# Patient Record
Sex: Male | Born: 1965 | Race: White | Hispanic: No | Marital: Married
Health system: Southern US, Academic
[De-identification: ages and names within clinical notes are randomized; demographics above are authoritative.]

## PROBLEM LIST (undated history)

## (undated) ENCOUNTER — Encounter

## (undated) ENCOUNTER — Telehealth

## (undated) ENCOUNTER — Encounter: Attending: Medical | Primary: Medical

## (undated) ENCOUNTER — Ambulatory Visit: Payer: PRIVATE HEALTH INSURANCE

## (undated) ENCOUNTER — Ambulatory Visit: Payer: PRIVATE HEALTH INSURANCE | Attending: Medical | Primary: Medical

## (undated) ENCOUNTER — Ambulatory Visit: Payer: BLUE CROSS/BLUE SHIELD

## (undated) ENCOUNTER — Telehealth: Payer: BLUE CROSS/BLUE SHIELD | Attending: Pain Medicine | Primary: Pain Medicine

## (undated) ENCOUNTER — Telehealth: Attending: Medical | Primary: Medical

## (undated) ENCOUNTER — Ambulatory Visit: Payer: Medicaid (Managed Care) | Attending: Medical | Primary: Medical

## (undated) ENCOUNTER — Ambulatory Visit

## (undated) ENCOUNTER — Encounter: Attending: Cardiovascular Disease | Primary: Cardiovascular Disease

## (undated) ENCOUNTER — Ambulatory Visit: Payer: Medicaid (Managed Care)

## (undated) ENCOUNTER — Telehealth: Attending: Cardiovascular Disease | Primary: Cardiovascular Disease

## (undated) ENCOUNTER — Telehealth: Attending: Pain Medicine | Primary: Pain Medicine

## (undated) ENCOUNTER — Encounter: Attending: Pain Medicine | Primary: Pain Medicine

## (undated) ENCOUNTER — Encounter: Payer: BLUE CROSS/BLUE SHIELD | Attending: Cardiovascular Disease | Primary: Cardiovascular Disease

## (undated) ENCOUNTER — Ambulatory Visit: Payer: PRIVATE HEALTH INSURANCE | Attending: Cardiovascular Disease | Primary: Cardiovascular Disease

## (undated) ENCOUNTER — Inpatient Hospital Stay: Payer: Medicaid (Managed Care)

## (undated) ENCOUNTER — Ambulatory Visit: Payer: Medicaid (Managed Care) | Attending: Cardiovascular Disease | Primary: Cardiovascular Disease

---
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
## 2017-04-26 ENCOUNTER — Ambulatory Visit
Admission: RE | Admit: 2017-04-26 | Discharge: 2017-04-26 | Disposition: A | Discharge status: Home / Self Care | Payer: PRIVATE HEALTH INSURANCE

## 2017-04-26 DIAGNOSIS — M461 Sacroiliitis, not elsewhere classified: Secondary | ICD-10-CM

## 2017-04-26 DIAGNOSIS — G894 Chronic pain syndrome: Principal | ICD-10-CM

## 2017-04-26 DIAGNOSIS — M4316 Spondylolisthesis, lumbar region: Secondary | ICD-10-CM

## 2017-04-26 DIAGNOSIS — Z981 Arthrodesis status: Secondary | ICD-10-CM

## 2017-04-30 MED ORDER — OXYCODONE ER 36 MG CAPSULE SPRINKLE EXTENDED RELEASE 12HR(DON'T CRUSH)
Freq: Two times a day (BID) | ORAL | 0 refills | 0.00000 days | Status: CP
Start: 2017-04-30 — End: 2017-06-28

## 2017-05-06 ENCOUNTER — Ambulatory Visit
Admission: RE | Admit: 2017-05-06 | Discharge: 2017-05-06 | Disposition: A | Discharge status: Home / Self Care | Payer: PRIVATE HEALTH INSURANCE

## 2017-05-06 ENCOUNTER — Ambulatory Visit
Admission: RE | Admit: 2017-05-06 | Discharge: 2017-05-06 | Disposition: A | Discharge status: Home / Self Care | Payer: MEDICAID

## 2017-05-06 DIAGNOSIS — G894 Chronic pain syndrome: Principal | ICD-10-CM

## 2017-05-06 DIAGNOSIS — M961 Postlaminectomy syndrome, not elsewhere classified: Principal | ICD-10-CM

## 2017-05-10 MED ORDER — BUTALBITAL-ACETAMINOPHEN-CAFFEINE 50 MG-300 MG-40 MG CAPSULE
ORAL_CAPSULE | Freq: Four times a day (QID) | ORAL | 0 refills | 0 days | Status: CP | PRN
Start: 2017-05-10 — End: 2017-05-11

## 2017-05-11 MED ORDER — BUTALBITAL-ACETAMINOPHEN-CAFFEINE 50 MG-300 MG-40 MG CAPSULE
ORAL_CAPSULE | Freq: Four times a day (QID) | ORAL | 0 refills | 0 days | Status: CP | PRN
Start: 2017-05-11 — End: 2017-06-10

## 2017-05-30 MED ORDER — OXYCODONE ER 36 MG CAPSULE SPRINKLE EXTENDED RELEASE 12HR(DON'T CRUSH)
Freq: Two times a day (BID) | ORAL | 0 refills | 0 days | Status: CP
Start: 2017-05-30 — End: 2018-02-22

## 2017-06-01 ENCOUNTER — Ambulatory Visit
Admission: RE | Admit: 2017-06-01 | Discharge: 2017-06-01 | Disposition: A | Discharge status: Home / Self Care | Payer: MEDICAID

## 2017-06-01 DIAGNOSIS — M47819 Spondylosis without myelopathy or radiculopathy, site unspecified: Secondary | ICD-10-CM

## 2017-06-01 DIAGNOSIS — M461 Sacroiliitis, not elsewhere classified: Secondary | ICD-10-CM

## 2017-06-01 DIAGNOSIS — M5416 Radiculopathy, lumbar region: Secondary | ICD-10-CM

## 2017-06-01 DIAGNOSIS — M7060 Trochanteric bursitis, unspecified hip: Secondary | ICD-10-CM

## 2017-06-01 DIAGNOSIS — M4310 Spondylolisthesis, site unspecified: Secondary | ICD-10-CM

## 2017-06-01 DIAGNOSIS — M961 Postlaminectomy syndrome, not elsewhere classified: Secondary | ICD-10-CM

## 2017-06-01 DIAGNOSIS — M47817 Spondylosis without myelopathy or radiculopathy, lumbosacral region: Secondary | ICD-10-CM

## 2017-06-01 DIAGNOSIS — M4316 Spondylolisthesis, lumbar region: Principal | ICD-10-CM

## 2017-06-25 ENCOUNTER — Ambulatory Visit: Admission: RE | Admit: 2017-06-25 | Discharge: 2017-06-25 | Payer: MEDICAID

## 2017-06-25 ENCOUNTER — Ambulatory Visit: Admission: RE | Admit: 2017-06-25 | Discharge: 2017-06-25 | Payer: PRIVATE HEALTH INSURANCE

## 2017-06-25 DIAGNOSIS — M47817 Spondylosis without myelopathy or radiculopathy, lumbosacral region: Secondary | ICD-10-CM

## 2017-06-25 DIAGNOSIS — M4316 Spondylolisthesis, lumbar region: Secondary | ICD-10-CM

## 2017-06-25 DIAGNOSIS — Z981 Arthrodesis status: Secondary | ICD-10-CM

## 2017-06-25 DIAGNOSIS — M5416 Radiculopathy, lumbar region: Secondary | ICD-10-CM

## 2017-06-25 DIAGNOSIS — M62830 Muscle spasm of back: Secondary | ICD-10-CM

## 2017-06-25 DIAGNOSIS — M461 Sacroiliitis, not elsewhere classified: Principal | ICD-10-CM

## 2017-06-25 DIAGNOSIS — M7060 Trochanteric bursitis, unspecified hip: Secondary | ICD-10-CM

## 2017-06-25 DIAGNOSIS — M47819 Spondylosis without myelopathy or radiculopathy, site unspecified: Secondary | ICD-10-CM

## 2017-06-25 DIAGNOSIS — M961 Postlaminectomy syndrome, not elsewhere classified: Secondary | ICD-10-CM

## 2017-06-28 ENCOUNTER — Ambulatory Visit
Admission: RE | Admit: 2017-06-28 | Discharge: 2017-06-28 | Disposition: A | Discharge status: Home / Self Care | Payer: PRIVATE HEALTH INSURANCE

## 2017-06-28 DIAGNOSIS — M4316 Spondylolisthesis, lumbar region: Secondary | ICD-10-CM

## 2017-06-28 DIAGNOSIS — M7060 Trochanteric bursitis, unspecified hip: Secondary | ICD-10-CM

## 2017-06-28 DIAGNOSIS — F1190 Opioid use, unspecified, uncomplicated: Secondary | ICD-10-CM

## 2017-06-28 DIAGNOSIS — M461 Sacroiliitis, not elsewhere classified: Principal | ICD-10-CM

## 2017-06-28 DIAGNOSIS — Z981 Arthrodesis status: Secondary | ICD-10-CM

## 2017-06-28 MED ORDER — OXYCODONE ER 36 MG CAPSULE SPRINKLE EXTENDED RELEASE 12HR(DON'T CRUSH): 36 mg | each | Freq: Two times a day (BID) | 0 refills | 0 days | Status: AC

## 2017-06-29 MED ORDER — OXYCODONE ER 36 MG CAPSULE SPRINKLE EXTENDED RELEASE 12HR(DON'T CRUSH)
Freq: Two times a day (BID) | ORAL | 0 refills | 0 days | Status: CP
Start: 2017-06-29 — End: 2017-06-28

## 2017-07-12 ENCOUNTER — Ambulatory Visit
Admission: RE | Admit: 2017-07-12 | Discharge: 2017-07-12 | Disposition: A | Discharge status: Home / Self Care | Payer: MEDICAID

## 2017-07-12 ENCOUNTER — Ambulatory Visit
Admission: RE | Admit: 2017-07-12 | Discharge: 2017-07-12 | Disposition: A | Discharge status: Home / Self Care | Payer: MEDICAID | Attending: Pain Medicine | Admitting: Pain Medicine

## 2017-07-12 DIAGNOSIS — M461 Sacroiliitis, not elsewhere classified: Principal | ICD-10-CM

## 2017-07-29 MED ORDER — OXYCODONE ER 36 MG CAPSULE SPRINKLE EXTENDED RELEASE 12HR(DON'T CRUSH)
Freq: Two times a day (BID) | ORAL | 0 refills | 0.00000 days | Status: CP
Start: 2017-07-29 — End: 2017-06-28

## 2017-08-27 ENCOUNTER — Ambulatory Visit: Admission: RE | Admit: 2017-08-27 | Discharge: 2017-08-27 | Payer: MEDICAID

## 2017-08-27 DIAGNOSIS — F1190 Opioid use, unspecified, uncomplicated: Secondary | ICD-10-CM

## 2017-08-27 DIAGNOSIS — Z981 Arthrodesis status: Secondary | ICD-10-CM

## 2017-08-27 DIAGNOSIS — M4316 Spondylolisthesis, lumbar region: Secondary | ICD-10-CM

## 2017-08-27 DIAGNOSIS — M461 Sacroiliitis, not elsewhere classified: Principal | ICD-10-CM

## 2017-08-30 ENCOUNTER — Ambulatory Visit
Admission: RE | Admit: 2017-08-30 | Discharge: 2017-08-30 | Disposition: A | Discharge status: Home / Self Care | Payer: PRIVATE HEALTH INSURANCE

## 2017-08-30 DIAGNOSIS — Z981 Arthrodesis status: Secondary | ICD-10-CM

## 2017-08-30 DIAGNOSIS — F1190 Opioid use, unspecified, uncomplicated: Secondary | ICD-10-CM

## 2017-08-30 DIAGNOSIS — M4316 Spondylolisthesis, lumbar region: Secondary | ICD-10-CM

## 2017-08-30 DIAGNOSIS — M461 Sacroiliitis, not elsewhere classified: Principal | ICD-10-CM

## 2017-08-30 MED ORDER — OXYCODONE ER 36 MG CAPSULE SPRINKLE EXTENDED RELEASE 12HR(DON'T CRUSH)
Freq: Two times a day (BID) | ORAL | 0 refills | 0.00000 days | Status: CP
Start: 2017-08-30 — End: 2018-02-22

## 2017-08-30 MED ORDER — OXYCODONE-ACETAMINOPHEN 5 MG-325 MG TABLET
ORAL_TABLET | Freq: Four times a day (QID) | ORAL | 0 refills | 0 days | Status: CP | PRN
Start: 2017-08-30 — End: 2017-10-29

## 2017-09-29 MED ORDER — OXYCODONE ER 36 MG CAPSULE SPRINKLE EXTENDED RELEASE 12HR(DON'T CRUSH)
Freq: Two times a day (BID) | ORAL | 0 refills | 0.00000 days | Status: CP
Start: 2017-09-29 — End: 2017-10-29

## 2017-09-30 MED ORDER — MORPHINE ER 30 MG TABLET,EXTENDED RELEASE
ORAL_TABLET | Freq: Three times a day (TID) | ORAL | 0 refills | 0.00000 days | Status: CP
Start: 2017-09-30 — End: 2017-10-30

## 2017-10-27 MED ORDER — DIAZEPAM 5 MG TABLET
ORAL_TABLET | Freq: Two times a day (BID) | ORAL | 0 refills | 0.00000 days | Status: CP
Start: 2017-10-27 — End: 2017-11-01

## 2017-10-28 ENCOUNTER — Encounter
Admit: 2017-10-28 | Discharge: 2017-10-28 | Disposition: A | Discharge status: Home / Self Care | Payer: BLUE CROSS/BLUE SHIELD

## 2017-10-29 ENCOUNTER — Ambulatory Visit: Admit: 2017-10-29 | Discharge: 2017-10-30 | Payer: BLUE CROSS/BLUE SHIELD

## 2017-10-29 DIAGNOSIS — M47817 Spondylosis without myelopathy or radiculopathy, lumbosacral region: Secondary | ICD-10-CM

## 2017-10-29 DIAGNOSIS — Z981 Arthrodesis status: Secondary | ICD-10-CM

## 2017-10-29 DIAGNOSIS — G894 Chronic pain syndrome: Principal | ICD-10-CM

## 2017-10-29 DIAGNOSIS — M5416 Radiculopathy, lumbar region: Secondary | ICD-10-CM

## 2017-10-29 MED ORDER — PREGABALIN 75 MG CAPSULE
ORAL_CAPSULE | Freq: Three times a day (TID) | ORAL | 0 refills | 0 days | Status: CP
Start: 2017-10-29 — End: 2017-12-08

## 2017-10-29 MED ORDER — MORPHINE 15 MG IMMEDIATE RELEASE TABLET
ORAL_TABLET | Freq: Two times a day (BID) | ORAL | 0 refills | 0.00000 days | Status: CP | PRN
Start: 2017-10-29 — End: 2017-11-18

## 2017-10-29 MED ORDER — FENTANYL 25 MCG/HR TRANSDERMAL PATCH
MEDICATED_PATCH | TRANSDERMAL | 0 refills | 0 days | Status: CP
Start: 2017-10-29 — End: 2017-11-18

## 2017-11-02 MED ORDER — OXYCODONE-ACETAMINOPHEN 10 MG-325 MG TABLET
ORAL_TABLET | Freq: Two times a day (BID) | ORAL | 0 refills | 0.00000 days | Status: CP | PRN
Start: 2017-11-02 — End: 2017-11-18

## 2017-11-18 ENCOUNTER — Encounter: Admit: 2017-11-18 | Discharge: 2017-11-18 | Payer: BLUE CROSS/BLUE SHIELD

## 2017-11-18 ENCOUNTER — Ambulatory Visit
Admit: 2017-11-18 | Discharge: 2017-11-18 | Payer: BLUE CROSS/BLUE SHIELD | Attending: Pain Medicine | Primary: Pain Medicine

## 2017-11-18 DIAGNOSIS — M5416 Radiculopathy, lumbar region: Principal | ICD-10-CM

## 2017-11-18 DIAGNOSIS — Z79899 Other long term (current) drug therapy: Secondary | ICD-10-CM

## 2017-11-18 DIAGNOSIS — G894 Chronic pain syndrome: Secondary | ICD-10-CM

## 2017-11-18 MED ORDER — FENTANYL 25 MCG/HR TRANSDERMAL PATCH
MEDICATED_PATCH | TRANSDERMAL | 0 refills | 0 days | Status: CP
Start: 2017-11-18 — End: 2017-12-08

## 2017-11-18 MED ORDER — OXYCODONE-ACETAMINOPHEN 10 MG-325 MG TABLET
ORAL_TABLET | Freq: Two times a day (BID) | ORAL | 0 refills | 0 days | Status: CP | PRN
Start: 2017-11-18 — End: 2017-12-08

## 2017-12-08 ENCOUNTER — Encounter: Admit: 2017-12-08 | Discharge: 2017-12-09 | Payer: BLUE CROSS/BLUE SHIELD

## 2017-12-08 DIAGNOSIS — M7060 Trochanteric bursitis, unspecified hip: Secondary | ICD-10-CM

## 2017-12-08 DIAGNOSIS — M62830 Muscle spasm of back: Secondary | ICD-10-CM

## 2017-12-08 DIAGNOSIS — M47817 Spondylosis without myelopathy or radiculopathy, lumbosacral region: Secondary | ICD-10-CM

## 2017-12-08 DIAGNOSIS — M961 Postlaminectomy syndrome, not elsewhere classified: Secondary | ICD-10-CM

## 2017-12-08 DIAGNOSIS — M4316 Spondylolisthesis, lumbar region: Principal | ICD-10-CM

## 2017-12-08 DIAGNOSIS — Z981 Arthrodesis status: Secondary | ICD-10-CM

## 2017-12-08 DIAGNOSIS — M47819 Spondylosis without myelopathy or radiculopathy, site unspecified: Secondary | ICD-10-CM

## 2017-12-08 DIAGNOSIS — M461 Sacroiliitis, not elsewhere classified: Secondary | ICD-10-CM

## 2017-12-08 DIAGNOSIS — G894 Chronic pain syndrome: Secondary | ICD-10-CM

## 2017-12-08 DIAGNOSIS — M5416 Radiculopathy, lumbar region: Secondary | ICD-10-CM

## 2017-12-08 MED ORDER — PREGABALIN 75 MG CAPSULE
ORAL_CAPSULE | Freq: Three times a day (TID) | ORAL | 1 refills | 0.00000 days | Status: CP
Start: 2017-12-08 — End: 2018-02-07

## 2017-12-18 MED ORDER — FENTANYL 25 MCG/HR TRANSDERMAL PATCH
MEDICATED_PATCH | TRANSDERMAL | 0 refills | 0 days | Status: CP
Start: 2017-12-18 — End: 2018-02-07

## 2017-12-18 MED ORDER — OXYCODONE-ACETAMINOPHEN 10 MG-325 MG TABLET
ORAL_TABLET | Freq: Two times a day (BID) | ORAL | 0 refills | 0 days | Status: CP | PRN
Start: 2017-12-18 — End: 2018-02-07

## 2017-12-29 ENCOUNTER — Encounter: Admit: 2017-12-29 | Discharge: 2017-12-30 | Payer: BLUE CROSS/BLUE SHIELD

## 2017-12-29 DIAGNOSIS — M961 Postlaminectomy syndrome, not elsewhere classified: Secondary | ICD-10-CM

## 2017-12-29 DIAGNOSIS — M47817 Spondylosis without myelopathy or radiculopathy, lumbosacral region: Secondary | ICD-10-CM

## 2017-12-29 DIAGNOSIS — Z981 Arthrodesis status: Secondary | ICD-10-CM

## 2017-12-29 DIAGNOSIS — M62830 Muscle spasm of back: Secondary | ICD-10-CM

## 2017-12-29 DIAGNOSIS — G894 Chronic pain syndrome: Secondary | ICD-10-CM

## 2017-12-29 DIAGNOSIS — M7060 Trochanteric bursitis, unspecified hip: Secondary | ICD-10-CM

## 2017-12-29 DIAGNOSIS — M47819 Spondylosis without myelopathy or radiculopathy, site unspecified: Secondary | ICD-10-CM

## 2017-12-29 DIAGNOSIS — M461 Sacroiliitis, not elsewhere classified: Secondary | ICD-10-CM

## 2017-12-29 DIAGNOSIS — M5416 Radiculopathy, lumbar region: Secondary | ICD-10-CM

## 2017-12-29 DIAGNOSIS — M4316 Spondylolisthesis, lumbar region: Principal | ICD-10-CM

## 2018-01-17 MED ORDER — OXYCODONE-ACETAMINOPHEN 10 MG-325 MG TABLET
ORAL_TABLET | Freq: Two times a day (BID) | ORAL | 0 refills | 0 days | Status: CP | PRN
Start: 2018-01-17 — End: 2018-02-07

## 2018-01-17 MED ORDER — FENTANYL 25 MCG/HR TRANSDERMAL PATCH
MEDICATED_PATCH | TRANSDERMAL | 0 refills | 0 days | Status: CP
Start: 2018-01-17 — End: 2018-02-07

## 2018-02-07 ENCOUNTER — Encounter: Admit: 2018-02-07 | Discharge: 2018-02-07 | Payer: BLUE CROSS/BLUE SHIELD

## 2018-02-07 ENCOUNTER — Ambulatory Visit: Admit: 2018-02-07 | Discharge: 2018-02-07 | Payer: BLUE CROSS/BLUE SHIELD

## 2018-02-07 DIAGNOSIS — Z981 Arthrodesis status: Secondary | ICD-10-CM

## 2018-02-07 DIAGNOSIS — M4316 Spondylolisthesis, lumbar region: Principal | ICD-10-CM

## 2018-02-07 DIAGNOSIS — M5412 Radiculopathy, cervical region: Principal | ICD-10-CM

## 2018-02-07 DIAGNOSIS — G894 Chronic pain syndrome: Secondary | ICD-10-CM

## 2018-02-07 DIAGNOSIS — M7060 Trochanteric bursitis, unspecified hip: Secondary | ICD-10-CM

## 2018-02-07 DIAGNOSIS — M5416 Radiculopathy, lumbar region: Secondary | ICD-10-CM

## 2018-02-07 DIAGNOSIS — M461 Sacroiliitis, not elsewhere classified: Secondary | ICD-10-CM

## 2018-02-07 MED ORDER — PREGABALIN 75 MG CAPSULE
ORAL_CAPSULE | Freq: Three times a day (TID) | ORAL | 1 refills | 0.00000 days | Status: CP
Start: 2018-02-07 — End: 2018-08-04

## 2018-02-15 ENCOUNTER — Ambulatory Visit: Admit: 2018-02-15 | Payer: BLUE CROSS/BLUE SHIELD

## 2018-02-16 MED ORDER — OXYCODONE-ACETAMINOPHEN 10 MG-325 MG TABLET
ORAL_TABLET | Freq: Two times a day (BID) | ORAL | 0 refills | 0.00000 days | Status: CP | PRN
Start: 2018-02-16 — End: 2018-04-08

## 2018-02-16 MED ORDER — FENTANYL 25 MCG/HR TRANSDERMAL PATCH
MEDICATED_PATCH | TRANSDERMAL | 0 refills | 0 days | Status: CP
Start: 2018-02-16 — End: 2018-04-08

## 2018-02-19 ENCOUNTER — Encounter: Admit: 2018-02-19 | Discharge: 2018-02-20 | Payer: BLUE CROSS/BLUE SHIELD

## 2018-02-22 ENCOUNTER — Encounter: Admit: 2018-02-22 | Discharge: 2018-02-22 | Payer: BLUE CROSS/BLUE SHIELD

## 2018-02-22 DIAGNOSIS — M5416 Radiculopathy, lumbar region: Principal | ICD-10-CM

## 2018-03-18 MED ORDER — OXYCODONE-ACETAMINOPHEN 10 MG-325 MG TABLET
ORAL_TABLET | Freq: Two times a day (BID) | ORAL | 0 refills | 0.00000 days | Status: CP | PRN
Start: 2018-03-18 — End: 2018-04-08

## 2018-03-18 MED ORDER — FENTANYL 25 MCG/HR TRANSDERMAL PATCH
MEDICATED_PATCH | TRANSDERMAL | 0 refills | 0 days | Status: CP
Start: 2018-03-18 — End: 2018-04-08

## 2018-04-08 ENCOUNTER — Encounter: Admit: 2018-04-08 | Discharge: 2018-04-09 | Payer: BLUE CROSS/BLUE SHIELD

## 2018-04-08 DIAGNOSIS — M5416 Radiculopathy, lumbar region: Secondary | ICD-10-CM

## 2018-04-08 DIAGNOSIS — M47819 Spondylosis without myelopathy or radiculopathy, site unspecified: Secondary | ICD-10-CM

## 2018-04-08 DIAGNOSIS — G894 Chronic pain syndrome: Principal | ICD-10-CM

## 2018-04-08 DIAGNOSIS — M5412 Radiculopathy, cervical region: Secondary | ICD-10-CM

## 2018-04-08 DIAGNOSIS — Z981 Arthrodesis status: Secondary | ICD-10-CM

## 2018-04-17 MED ORDER — OXYCODONE-ACETAMINOPHEN 10 MG-325 MG TABLET
ORAL_TABLET | Freq: Two times a day (BID) | ORAL | 0 refills | 0.00000 days | Status: CP | PRN
Start: 2018-04-17 — End: 2018-05-17

## 2018-04-17 MED ORDER — FENTANYL 25 MCG/HR TRANSDERMAL PATCH
MEDICATED_PATCH | TRANSDERMAL | 0 refills | 0.00000 days | Status: CP
Start: 2018-04-17 — End: 2018-05-17

## 2018-04-19 ENCOUNTER — Encounter: Admit: 2018-04-19 | Discharge: 2018-04-19 | Payer: BLUE CROSS/BLUE SHIELD

## 2018-04-19 DIAGNOSIS — M62830 Muscle spasm of back: Secondary | ICD-10-CM

## 2018-04-19 DIAGNOSIS — M5416 Radiculopathy, lumbar region: Secondary | ICD-10-CM

## 2018-04-19 DIAGNOSIS — M4310 Spondylolisthesis, site unspecified: Secondary | ICD-10-CM

## 2018-04-19 DIAGNOSIS — M4316 Spondylolisthesis, lumbar region: Secondary | ICD-10-CM

## 2018-04-19 DIAGNOSIS — M545 Low back pain: Principal | ICD-10-CM

## 2018-04-19 DIAGNOSIS — M47819 Spondylosis without myelopathy or radiculopathy, site unspecified: Secondary | ICD-10-CM

## 2018-04-19 DIAGNOSIS — M47817 Spondylosis without myelopathy or radiculopathy, lumbosacral region: Principal | ICD-10-CM

## 2018-04-19 DIAGNOSIS — M7060 Trochanteric bursitis, unspecified hip: Secondary | ICD-10-CM

## 2018-04-19 DIAGNOSIS — M461 Sacroiliitis, not elsewhere classified: Secondary | ICD-10-CM

## 2018-04-19 DIAGNOSIS — M961 Postlaminectomy syndrome, not elsewhere classified: Secondary | ICD-10-CM

## 2018-04-19 DIAGNOSIS — Z981 Arthrodesis status: Secondary | ICD-10-CM

## 2018-05-17 MED ORDER — OXYCODONE-ACETAMINOPHEN 10 MG-325 MG TABLET
ORAL_TABLET | Freq: Two times a day (BID) | ORAL | 0 refills | 0.00000 days | Status: CP | PRN
Start: 2018-05-17 — End: 2018-07-27

## 2018-05-17 MED ORDER — FENTANYL 25 MCG/HR TRANSDERMAL PATCH
MEDICATED_PATCH | TRANSDERMAL | 0 refills | 0.00000 days | Status: CP
Start: 2018-05-17 — End: 2018-07-07

## 2018-06-28 ENCOUNTER — Encounter: Admit: 2018-06-28 | Discharge: 2018-06-29 | Payer: BLUE CROSS/BLUE SHIELD

## 2018-06-28 DIAGNOSIS — Z981 Arthrodesis status: Secondary | ICD-10-CM

## 2018-06-28 DIAGNOSIS — M4316 Spondylolisthesis, lumbar region: Secondary | ICD-10-CM

## 2018-06-28 DIAGNOSIS — M47817 Spondylosis without myelopathy or radiculopathy, lumbosacral region: Principal | ICD-10-CM

## 2018-06-28 DIAGNOSIS — G894 Chronic pain syndrome: Secondary | ICD-10-CM

## 2018-06-28 DIAGNOSIS — M461 Sacroiliitis, not elsewhere classified: Secondary | ICD-10-CM

## 2018-06-28 MED ORDER — OXYCODONE-ACETAMINOPHEN 10 MG-325 MG TABLET
ORAL_TABLET | ORAL | 0 refills | 0.00000 days | Status: CP | PRN
Start: 2018-06-28 — End: 2018-07-27

## 2018-06-28 MED ORDER — OXYCODONE ER 18 MG CAPSULE SPRINKLE EXTENDED RELEASE 12HR(DON'T CRUSH)
ORAL_CAPSULE | Freq: Two times a day (BID) | ORAL | 0 refills | 0 days | Status: CP
Start: 2018-06-28 — End: 2018-07-28

## 2018-07-07 MED ORDER — FENTANYL 25 MCG/HR TRANSDERMAL PATCH
MEDICATED_PATCH | TRANSDERMAL | 0 refills | 0.00000 days | Status: CP
Start: 2018-07-07 — End: 2018-09-01

## 2018-07-19 ENCOUNTER — Encounter: Admit: 2018-07-19 | Discharge: 2018-07-19 | Payer: BLUE CROSS/BLUE SHIELD

## 2018-07-19 DIAGNOSIS — M47819 Spondylosis without myelopathy or radiculopathy, site unspecified: Principal | ICD-10-CM

## 2018-07-27 ENCOUNTER — Encounter: Admit: 2018-07-27 | Discharge: 2018-07-28 | Payer: BLUE CROSS/BLUE SHIELD

## 2018-07-27 DIAGNOSIS — M47817 Spondylosis without myelopathy or radiculopathy, lumbosacral region: Secondary | ICD-10-CM

## 2018-07-27 DIAGNOSIS — Z981 Arthrodesis status: Secondary | ICD-10-CM

## 2018-07-27 DIAGNOSIS — G894 Chronic pain syndrome: Principal | ICD-10-CM

## 2018-07-27 DIAGNOSIS — M5412 Radiculopathy, cervical region: Secondary | ICD-10-CM

## 2018-07-27 MED ORDER — OXYCODONE-ACETAMINOPHEN 10 MG-325 MG TABLET
ORAL_TABLET | 0 refills | 0 days | Status: CP
Start: 2018-07-27 — End: 2018-09-01

## 2018-08-12 ENCOUNTER — Encounter: Admit: 2018-08-12 | Discharge: 2018-08-12 | Payer: BLUE CROSS/BLUE SHIELD

## 2018-08-12 ENCOUNTER — Encounter
Admit: 2018-08-12 | Discharge: 2018-08-12 | Payer: BLUE CROSS/BLUE SHIELD | Attending: Pain Medicine | Primary: Pain Medicine

## 2018-08-12 DIAGNOSIS — M47816 Spondylosis without myelopathy or radiculopathy, lumbar region: Principal | ICD-10-CM

## 2018-08-16 MED ORDER — PREGABALIN 75 MG CAPSULE
Freq: Three times a day (TID) | ORAL | 1 refills | 0 days | Status: CP
Start: 2018-08-16 — End: 2018-11-20

## 2018-08-25 MED ORDER — MORPHINE ER 30 MG TABLET,EXTENDED RELEASE
ORAL_TABLET | Freq: Two times a day (BID) | ORAL | 0 refills | 0.00000 days | Status: CP
Start: 2018-08-25 — End: 2018-09-12

## 2018-08-26 MED ORDER — OXYCODONE-ACETAMINOPHEN 10 MG-325 MG TABLET
ORAL_TABLET | Freq: Three times a day (TID) | ORAL | 0 refills | 0.00000 days | Status: CP | PRN
Start: 2018-08-26 — End: 2018-09-12

## 2018-09-01 ENCOUNTER — Encounter
Admit: 2018-09-01 | Discharge: 2018-09-01 | Payer: BLUE CROSS/BLUE SHIELD | Attending: Internal Medicine | Primary: Internal Medicine

## 2018-09-01 DIAGNOSIS — R002 Palpitations: Principal | ICD-10-CM

## 2018-09-01 DIAGNOSIS — R55 Syncope and collapse: Secondary | ICD-10-CM

## 2018-09-01 DIAGNOSIS — E782 Mixed hyperlipidemia: Secondary | ICD-10-CM

## 2018-09-01 DIAGNOSIS — R0602 Shortness of breath: Secondary | ICD-10-CM

## 2018-09-06 ENCOUNTER — Ambulatory Visit: Admit: 2018-09-06 | Discharge: 2018-09-08 | Payer: BLUE CROSS/BLUE SHIELD

## 2018-09-06 ENCOUNTER — Encounter: Admit: 2018-09-06 | Discharge: 2018-09-08 | Payer: BLUE CROSS/BLUE SHIELD

## 2018-09-06 DIAGNOSIS — E782 Mixed hyperlipidemia: Secondary | ICD-10-CM

## 2018-09-06 DIAGNOSIS — M47819 Spondylosis without myelopathy or radiculopathy, site unspecified: Secondary | ICD-10-CM

## 2018-09-06 DIAGNOSIS — R0602 Shortness of breath: Principal | ICD-10-CM

## 2018-09-06 DIAGNOSIS — R002 Palpitations: Principal | ICD-10-CM

## 2018-09-06 DIAGNOSIS — R55 Syncope and collapse: Secondary | ICD-10-CM

## 2018-09-06 DIAGNOSIS — M4310 Spondylolisthesis, site unspecified: Secondary | ICD-10-CM

## 2018-09-06 DIAGNOSIS — M461 Sacroiliitis, not elsewhere classified: Secondary | ICD-10-CM

## 2018-09-12 ENCOUNTER — Encounter: Admit: 2018-09-12 | Discharge: 2018-09-13 | Payer: BLUE CROSS/BLUE SHIELD

## 2018-09-12 DIAGNOSIS — M7060 Trochanteric bursitis, unspecified hip: Secondary | ICD-10-CM

## 2018-09-12 DIAGNOSIS — Z981 Arthrodesis status: Secondary | ICD-10-CM

## 2018-09-12 DIAGNOSIS — M5412 Radiculopathy, cervical region: Secondary | ICD-10-CM

## 2018-09-12 DIAGNOSIS — M47817 Spondylosis without myelopathy or radiculopathy, lumbosacral region: Secondary | ICD-10-CM

## 2018-09-12 DIAGNOSIS — M4316 Spondylolisthesis, lumbar region: Secondary | ICD-10-CM

## 2018-09-12 DIAGNOSIS — M47816 Spondylosis without myelopathy or radiculopathy, lumbar region: Secondary | ICD-10-CM

## 2018-09-12 DIAGNOSIS — M461 Sacroiliitis, not elsewhere classified: Secondary | ICD-10-CM

## 2018-09-12 DIAGNOSIS — G894 Chronic pain syndrome: Secondary | ICD-10-CM

## 2018-09-12 DIAGNOSIS — M5416 Radiculopathy, lumbar region: Secondary | ICD-10-CM

## 2018-09-12 DIAGNOSIS — M47819 Spondylosis without myelopathy or radiculopathy, site unspecified: Principal | ICD-10-CM

## 2018-09-12 DIAGNOSIS — M4310 Spondylolisthesis, site unspecified: Secondary | ICD-10-CM

## 2018-09-15 MED ORDER — OXYCODONE-ACETAMINOPHEN 10 MG-325 MG TABLET
ORAL_TABLET | Freq: Three times a day (TID) | ORAL | 0 refills | 0.00000 days | Status: CP | PRN
Start: 2018-09-15 — End: 2018-09-25

## 2018-09-25 MED ORDER — OXYCODONE-ACETAMINOPHEN 10 MG-325 MG TABLET
ORAL_TABLET | Freq: Three times a day (TID) | ORAL | 0 refills | 0 days | Status: CP | PRN
Start: 2018-09-25 — End: 2018-11-22

## 2018-09-25 MED ORDER — MORPHINE ER 30 MG TABLET,EXTENDED RELEASE
ORAL_TABLET | Freq: Two times a day (BID) | ORAL | 0 refills | 0.00000 days | Status: CP
Start: 2018-09-25 — End: 2018-11-22

## 2018-10-25 MED ORDER — OXYCODONE-ACETAMINOPHEN 10 MG-325 MG TABLET
ORAL_TABLET | Freq: Three times a day (TID) | ORAL | 0 refills | 0.00000 days | Status: CP | PRN
Start: 2018-10-25 — End: 2018-11-22

## 2018-10-25 MED ORDER — MORPHINE ER 30 MG TABLET,EXTENDED RELEASE
ORAL_TABLET | Freq: Two times a day (BID) | ORAL | 0 refills | 0.00000 days | Status: CP
Start: 2018-10-25 — End: 2018-11-22

## 2018-11-20 MED ORDER — PREGABALIN 75 MG CAPSULE
0 refills | 0 days | Status: CP
Start: 2018-11-20 — End: 2019-04-19

## 2018-11-22 ENCOUNTER — Encounter: Admit: 2018-11-22 | Discharge: 2018-11-23 | Payer: BLUE CROSS/BLUE SHIELD

## 2018-11-22 DIAGNOSIS — Z981 Arthrodesis status: Principal | ICD-10-CM

## 2018-11-22 DIAGNOSIS — M4316 Spondylolisthesis, lumbar region: Principal | ICD-10-CM

## 2018-11-22 DIAGNOSIS — M5090 Cervical disc disorder, unspecified, unspecified cervical region: Principal | ICD-10-CM

## 2018-11-22 DIAGNOSIS — M47817 Spondylosis without myelopathy or radiculopathy, lumbosacral region: Principal | ICD-10-CM

## 2018-11-22 DIAGNOSIS — M5416 Radiculopathy, lumbar region: Principal | ICD-10-CM

## 2018-11-22 DIAGNOSIS — G894 Chronic pain syndrome: Principal | ICD-10-CM

## 2018-11-22 DIAGNOSIS — G43909 Migraine, unspecified, not intractable, without status migrainosus: Principal | ICD-10-CM

## 2018-11-22 DIAGNOSIS — M7060 Trochanteric bursitis, unspecified hip: Principal | ICD-10-CM

## 2018-11-22 DIAGNOSIS — E139 Other specified diabetes mellitus without complications: Principal | ICD-10-CM

## 2018-11-22 DIAGNOSIS — M5412 Radiculopathy, cervical region: Principal | ICD-10-CM

## 2018-11-22 DIAGNOSIS — M519 Unspecified thoracic, thoracolumbar and lumbosacral intervertebral disc disorder: Principal | ICD-10-CM

## 2018-11-22 DIAGNOSIS — M461 Sacroiliitis, not elsewhere classified: Principal | ICD-10-CM

## 2018-11-24 MED ORDER — MORPHINE ER 30 MG TABLET,EXTENDED RELEASE
ORAL_TABLET | Freq: Two times a day (BID) | ORAL | 0 refills | 0 days | Status: CP
Start: 2018-11-24 — End: 2018-12-24

## 2018-11-28 MED ORDER — OXYCODONE-ACETAMINOPHEN 10 MG-325 MG TABLET
ORAL_TABLET | Freq: Three times a day (TID) | ORAL | 0 refills | 0 days | Status: CP | PRN
Start: 2018-11-28 — End: 2018-12-24

## 2018-12-24 MED ORDER — MORPHINE ER 30 MG TABLET,EXTENDED RELEASE
ORAL_TABLET | Freq: Two times a day (BID) | ORAL | 0 refills | 0 days | Status: CP
Start: 2018-12-24 — End: 2019-02-14

## 2018-12-24 MED ORDER — OXYCODONE-ACETAMINOPHEN 10 MG-325 MG TABLET
ORAL_TABLET | Freq: Three times a day (TID) | ORAL | 0 refills | 0.00000 days | Status: CP | PRN
Start: 2018-12-24 — End: 2019-02-14

## 2019-01-23 MED ORDER — OXYCODONE-ACETAMINOPHEN 10 MG-325 MG TABLET
ORAL_TABLET | Freq: Three times a day (TID) | ORAL | 0 refills | 0 days | Status: CP | PRN
Start: 2019-01-23 — End: 2019-03-20

## 2019-01-23 MED ORDER — MORPHINE ER 30 MG TABLET,EXTENDED RELEASE
ORAL_TABLET | Freq: Two times a day (BID) | ORAL | 0 refills | 0 days | Status: CP
Start: 2019-01-23 — End: 2019-03-20

## 2019-01-29 ENCOUNTER — Ambulatory Visit: Admit: 2019-01-29 | Payer: BLUE CROSS/BLUE SHIELD

## 2019-02-22 MED ORDER — MORPHINE ER 30 MG TABLET,EXTENDED RELEASE
ORAL_TABLET | Freq: Two times a day (BID) | ORAL | 0 refills | 0 days | Status: CP
Start: 2019-02-22 — End: 2019-04-19

## 2019-02-22 MED ORDER — OXYCODONE-ACETAMINOPHEN 10 MG-325 MG TABLET
ORAL_TABLET | Freq: Three times a day (TID) | ORAL | 0 refills | 0 days | Status: CP | PRN
Start: 2019-02-22 — End: 2019-04-19

## 2019-03-08 ENCOUNTER — Encounter
Admit: 2019-03-08 | Discharge: 2019-03-08 | Disposition: A | Discharge status: Home / Self Care | Payer: BLUE CROSS/BLUE SHIELD | Attending: Emergency Medicine

## 2019-03-20 ENCOUNTER — Encounter: Admit: 2019-03-20 | Discharge: 2019-03-21 | Payer: BLUE CROSS/BLUE SHIELD

## 2019-03-20 DIAGNOSIS — M5416 Radiculopathy, lumbar region: Secondary | ICD-10-CM

## 2019-03-20 DIAGNOSIS — M47817 Spondylosis without myelopathy or radiculopathy, lumbosacral region: Secondary | ICD-10-CM

## 2019-03-20 DIAGNOSIS — Z981 Arthrodesis status: Secondary | ICD-10-CM

## 2019-03-20 DIAGNOSIS — G894 Chronic pain syndrome: Principal | ICD-10-CM

## 2019-03-20 DIAGNOSIS — F329 Major depressive disorder, single episode, unspecified: Secondary | ICD-10-CM

## 2019-03-20 MED ORDER — KETOROLAC 10 MG TABLET
ORAL_TABLET | Freq: Four times a day (QID) | ORAL | 0 refills | 0 days | Status: CP | PRN
Start: 2019-03-20 — End: 2019-03-25

## 2019-03-20 MED ORDER — DULOXETINE 30 MG CAPSULE,DELAYED RELEASE
ORAL_CAPSULE | 0 refills | 0 days | Status: CP
Start: 2019-03-20 — End: ?

## 2019-03-24 MED ORDER — OXYCODONE-ACETAMINOPHEN 10 MG-325 MG TABLET
ORAL_TABLET | Freq: Three times a day (TID) | ORAL | 0 refills | 0 days | Status: CP | PRN
Start: 2019-03-24 — End: 2019-04-23

## 2019-03-24 MED ORDER — MORPHINE ER 30 MG TABLET,EXTENDED RELEASE
ORAL_TABLET | Freq: Two times a day (BID) | ORAL | 0 refills | 0.00000 days | Status: CP
Start: 2019-03-24 — End: 2019-04-23

## 2019-04-22 MED ORDER — MORPHINE ER 30 MG TABLET,EXTENDED RELEASE
ORAL_TABLET | Freq: Two times a day (BID) | ORAL | 0 refills | 30 days | Status: CP
Start: 2019-04-22 — End: 2019-05-23

## 2019-04-22 MED ORDER — OXYCODONE-ACETAMINOPHEN 10 MG-325 MG TABLET
ORAL_TABLET | Freq: Three times a day (TID) | ORAL | 0 refills | 30 days | Status: CP | PRN
Start: 2019-04-22 — End: 2019-05-23

## 2019-05-23 DIAGNOSIS — M5412 Radiculopathy, cervical region: Secondary | ICD-10-CM

## 2019-05-23 DIAGNOSIS — Z981 Arthrodesis status: Secondary | ICD-10-CM

## 2019-05-23 DIAGNOSIS — G894 Chronic pain syndrome: Secondary | ICD-10-CM

## 2019-05-23 DIAGNOSIS — M47817 Spondylosis without myelopathy or radiculopathy, lumbosacral region: Secondary | ICD-10-CM

## 2019-06-22 ENCOUNTER — Ambulatory Visit: Admit: 2019-06-22 | Discharge: 2019-06-23 | Payer: BLUE CROSS/BLUE SHIELD

## 2019-06-22 DIAGNOSIS — M549 Dorsalgia, unspecified: Secondary | ICD-10-CM

## 2019-06-22 DIAGNOSIS — M47817 Spondylosis without myelopathy or radiculopathy, lumbosacral region: Secondary | ICD-10-CM

## 2019-06-22 DIAGNOSIS — G894 Chronic pain syndrome: Secondary | ICD-10-CM

## 2019-06-22 DIAGNOSIS — M5412 Radiculopathy, cervical region: Secondary | ICD-10-CM

## 2019-06-22 DIAGNOSIS — Z981 Arthrodesis status: Secondary | ICD-10-CM

## 2019-06-22 MED ORDER — OXYCODONE-ACETAMINOPHEN 10 MG-325 MG TABLET
ORAL_TABLET | Freq: Three times a day (TID) | ORAL | 0 refills | 30.00000 days | Status: CP | PRN
Start: 2019-06-22 — End: 2019-07-22

## 2019-06-22 MED ORDER — MORPHINE ER 30 MG TABLET,EXTENDED RELEASE
ORAL_TABLET | Freq: Two times a day (BID) | ORAL | 0 refills | 30.00000 days | Status: CP
Start: 2019-06-22 — End: 2019-07-23

## 2019-07-19 ENCOUNTER — Encounter: Admit: 2019-07-19 | Discharge: 2019-07-20 | Payer: BLUE CROSS/BLUE SHIELD

## 2019-07-19 DIAGNOSIS — M47817 Spondylosis without myelopathy or radiculopathy, lumbosacral region: Principal | ICD-10-CM

## 2019-07-19 DIAGNOSIS — Z981 Arthrodesis status: Principal | ICD-10-CM

## 2019-07-19 DIAGNOSIS — G894 Chronic pain syndrome: Principal | ICD-10-CM

## 2019-07-22 MED ORDER — MORPHINE ER 30 MG TABLET,EXTENDED RELEASE
ORAL_TABLET | Freq: Two times a day (BID) | ORAL | 0 refills | 30.00000 days | Status: CP
Start: 2019-07-22 — End: 2019-08-21

## 2019-07-22 MED ORDER — OXYCODONE-ACETAMINOPHEN 10 MG-325 MG TABLET
ORAL_TABLET | Freq: Three times a day (TID) | ORAL | 0 refills | 30.00000 days | Status: CP | PRN
Start: 2019-07-22 — End: 2019-08-21

## 2019-08-21 MED ORDER — MORPHINE ER 30 MG TABLET,EXTENDED RELEASE
ORAL_TABLET | Freq: Two times a day (BID) | ORAL | 0 refills | 30.00000 days | Status: CP
Start: 2019-08-21 — End: 2019-09-20

## 2019-08-21 MED ORDER — OXYCODONE-ACETAMINOPHEN 10 MG-325 MG TABLET
ORAL_TABLET | Freq: Three times a day (TID) | ORAL | 0 refills | 30.00000 days | Status: CP | PRN
Start: 2019-08-21 — End: 2019-09-20

## 2019-09-19 ENCOUNTER — Encounter: Admit: 2019-09-19 | Discharge: 2019-09-20 | Payer: BLUE CROSS/BLUE SHIELD

## 2019-09-19 DIAGNOSIS — Z981 Arthrodesis status: Principal | ICD-10-CM

## 2019-09-19 DIAGNOSIS — F1190 Opioid use, unspecified, uncomplicated: Principal | ICD-10-CM

## 2019-09-19 DIAGNOSIS — G894 Chronic pain syndrome: Principal | ICD-10-CM

## 2019-09-19 DIAGNOSIS — G8929 Other chronic pain: Principal | ICD-10-CM

## 2019-09-19 DIAGNOSIS — M545 Low back pain: Principal | ICD-10-CM

## 2019-09-19 DIAGNOSIS — M4316 Spondylolisthesis, lumbar region: Principal | ICD-10-CM

## 2019-09-19 DIAGNOSIS — Z79899 Other long term (current) drug therapy: Principal | ICD-10-CM

## 2019-09-19 DIAGNOSIS — M47817 Spondylosis without myelopathy or radiculopathy, lumbosacral region: Principal | ICD-10-CM

## 2019-09-19 DIAGNOSIS — M5416 Radiculopathy, lumbar region: Principal | ICD-10-CM

## 2019-09-19 MED ORDER — KETOROLAC 10 MG TABLET
ORAL_TABLET | Freq: Four times a day (QID) | ORAL | 0 refills | 5 days | Status: CP | PRN
Start: 2019-09-19 — End: ?

## 2019-09-19 MED ORDER — NALOXONE 4 MG/ACTUATION NASAL SPRAY
1 refills | 0 days | Status: CP
Start: 2019-09-19 — End: ?

## 2019-09-20 MED ORDER — MORPHINE ER 30 MG TABLET,EXTENDED RELEASE
ORAL_TABLET | Freq: Two times a day (BID) | ORAL | 0 refills | 30 days | Status: CP
Start: 2019-09-20 — End: 2019-10-20

## 2019-09-20 MED ORDER — OXYCODONE-ACETAMINOPHEN 10 MG-325 MG TABLET
ORAL_TABLET | Freq: Three times a day (TID) | ORAL | 0 refills | 30 days | Status: CP | PRN
Start: 2019-09-20 — End: 2019-10-20

## 2019-10-20 MED ORDER — MORPHINE ER 30 MG TABLET,EXTENDED RELEASE
ORAL_TABLET | Freq: Two times a day (BID) | ORAL | 0 refills | 30 days | Status: CP
Start: 2019-10-20 — End: 2019-11-19

## 2019-10-20 MED ORDER — OXYCODONE-ACETAMINOPHEN 10 MG-325 MG TABLET
ORAL_TABLET | Freq: Three times a day (TID) | ORAL | 0 refills | 30.00000 days | Status: CP | PRN
Start: 2019-10-20 — End: 2019-11-19

## 2019-11-18 DIAGNOSIS — G894 Chronic pain syndrome: Principal | ICD-10-CM

## 2019-11-18 DIAGNOSIS — M47817 Spondylosis without myelopathy or radiculopathy, lumbosacral region: Principal | ICD-10-CM

## 2019-11-18 MED ORDER — OXYCODONE-ACETAMINOPHEN 10 MG-325 MG TABLET
ORAL_TABLET | Freq: Three times a day (TID) | ORAL | 0 refills | 30 days | Status: CP | PRN
Start: 2019-11-18 — End: 2019-12-18

## 2019-11-18 MED ORDER — MORPHINE ER 30 MG TABLET,EXTENDED RELEASE
ORAL_TABLET | Freq: Two times a day (BID) | ORAL | 0 refills | 30.00000 days | Status: CP
Start: 2019-11-18 — End: 2019-12-18

## 2019-11-25 ENCOUNTER — Ambulatory Visit
Admit: 2019-11-25 | Discharge: 2019-11-25 | Disposition: A | Discharge status: Home / Self Care | Payer: BLUE CROSS/BLUE SHIELD | Attending: Emergency Medicine

## 2019-12-08 ENCOUNTER — Encounter: Admit: 2019-12-08 | Discharge: 2019-12-09 | Payer: BLUE CROSS/BLUE SHIELD

## 2019-12-08 DIAGNOSIS — M5416 Radiculopathy, lumbar region: Principal | ICD-10-CM

## 2019-12-08 DIAGNOSIS — G894 Chronic pain syndrome: Principal | ICD-10-CM

## 2019-12-08 DIAGNOSIS — F1190 Opioid use, unspecified, uncomplicated: Principal | ICD-10-CM

## 2019-12-08 DIAGNOSIS — Z79891 Long term (current) use of opiate analgesic: Principal | ICD-10-CM

## 2019-12-08 DIAGNOSIS — Z5181 Encounter for therapeutic drug level monitoring: Secondary | ICD-10-CM

## 2019-12-08 DIAGNOSIS — Z981 Arthrodesis status: Principal | ICD-10-CM

## 2019-12-08 DIAGNOSIS — M4316 Spondylolisthesis, lumbar region: Principal | ICD-10-CM

## 2019-12-08 DIAGNOSIS — M47817 Spondylosis without myelopathy or radiculopathy, lumbosacral region: Principal | ICD-10-CM

## 2019-12-08 MED ORDER — METHOCARBAMOL 500 MG TABLET
ORAL_TABLET | Freq: Three times a day (TID) | ORAL | 1 refills | 30 days | Status: CP | PRN
Start: 2019-12-08 — End: ?

## 2019-12-18 MED ORDER — MORPHINE ER 30 MG TABLET,EXTENDED RELEASE
ORAL_TABLET | Freq: Two times a day (BID) | ORAL | 0 refills | 30.00000 days | Status: CP
Start: 2019-12-18 — End: 2020-01-17

## 2019-12-18 MED ORDER — OXYCODONE-ACETAMINOPHEN 10 MG-325 MG TABLET
ORAL_TABLET | Freq: Three times a day (TID) | ORAL | 0 refills | 30 days | Status: CP | PRN
Start: 2019-12-18 — End: 2020-01-17

## 2020-01-08 ENCOUNTER — Ambulatory Visit: Admit: 2020-01-08 | Discharge: 2020-01-09 | Payer: BLUE CROSS/BLUE SHIELD

## 2020-01-08 DIAGNOSIS — Z981 Arthrodesis status: Principal | ICD-10-CM

## 2020-01-08 DIAGNOSIS — M47817 Spondylosis without myelopathy or radiculopathy, lumbosacral region: Principal | ICD-10-CM

## 2020-01-08 DIAGNOSIS — G894 Chronic pain syndrome: Principal | ICD-10-CM

## 2020-01-08 DIAGNOSIS — M5416 Radiculopathy, lumbar region: Principal | ICD-10-CM

## 2020-01-08 MED ORDER — CYCLOBENZAPRINE 10 MG TABLET
ORAL_TABLET | Freq: Two times a day (BID) | ORAL | 0 refills | 30.00000 days | Status: CP | PRN
Start: 2020-01-08 — End: 2020-02-07

## 2020-01-08 MED ORDER — PREGABALIN 75 MG CAPSULE
ORAL_CAPSULE | Freq: Three times a day (TID) | ORAL | 0 refills | 30 days | Status: CP
Start: 2020-01-08 — End: 2020-01-08

## 2020-01-08 MED ORDER — PREGABALIN 75 MG CAPSULE: 75 mg | capsule | Freq: Three times a day (TID) | 0 refills | 30 days | Status: AC

## 2020-01-08 MED ORDER — LUBIPROSTONE 24 MCG CAPSULE
ORAL_CAPSULE | Freq: Two times a day (BID) | ORAL | 0 refills | 30.00000 days | Status: CP
Start: 2020-01-08 — End: 2020-02-07

## 2020-01-17 DIAGNOSIS — M47816 Spondylosis without myelopathy or radiculopathy, lumbar region: Principal | ICD-10-CM

## 2020-01-17 MED ORDER — OXYCODONE-ACETAMINOPHEN 10 MG-325 MG TABLET
ORAL_TABLET | Freq: Three times a day (TID) | ORAL | 0 refills | 30 days | Status: CP | PRN
Start: 2020-01-17 — End: 2020-02-16

## 2020-01-17 MED ORDER — MORPHINE ER 30 MG TABLET,EXTENDED RELEASE
ORAL_TABLET | Freq: Two times a day (BID) | ORAL | 0 refills | 30.00000 days | Status: CP
Start: 2020-01-17 — End: 2020-02-16

## 2020-02-05 ENCOUNTER — Ambulatory Visit: Admit: 2020-02-05 | Discharge: 2020-02-06 | Payer: BLUE CROSS/BLUE SHIELD

## 2020-02-05 DIAGNOSIS — Z981 Arthrodesis status: Principal | ICD-10-CM

## 2020-02-05 DIAGNOSIS — M47817 Spondylosis without myelopathy or radiculopathy, lumbosacral region: Principal | ICD-10-CM

## 2020-02-05 DIAGNOSIS — M5416 Radiculopathy, lumbar region: Principal | ICD-10-CM

## 2020-02-05 DIAGNOSIS — G894 Chronic pain syndrome: Principal | ICD-10-CM

## 2020-02-05 MED ORDER — PREGABALIN 75 MG CAPSULE
ORAL_CAPSULE | Freq: Three times a day (TID) | ORAL | 5 refills | 30.00000 days | Status: CP
Start: 2020-02-05 — End: 2020-08-03

## 2020-02-05 MED ORDER — NALOXEGOL 25 MG TABLET
ORAL_TABLET | Freq: Every day | ORAL | 0 refills | 30 days | Status: CP
Start: 2020-02-05 — End: 2020-03-06

## 2020-02-05 MED ORDER — CYCLOBENZAPRINE 10 MG TABLET
ORAL_TABLET | Freq: Two times a day (BID) | ORAL | 5 refills | 30 days | Status: CP | PRN
Start: 2020-02-05 — End: 2020-08-03

## 2020-02-08 ENCOUNTER — Encounter: Admit: 2020-02-08 | Discharge: 2020-02-09 | Payer: BLUE CROSS/BLUE SHIELD

## 2020-02-16 MED ORDER — MORPHINE ER 30 MG TABLET,EXTENDED RELEASE
ORAL_TABLET | Freq: Two times a day (BID) | ORAL | 0 refills | 30.00000 days | Status: CP
Start: 2020-02-16 — End: 2020-03-17

## 2020-02-16 MED ORDER — OXYCODONE-ACETAMINOPHEN 10 MG-325 MG TABLET
ORAL_TABLET | Freq: Three times a day (TID) | ORAL | 0 refills | 30 days | Status: CP | PRN
Start: 2020-02-16 — End: 2020-03-17

## 2020-02-29 ENCOUNTER — Encounter: Admit: 2020-02-29 | Discharge: 2020-03-01 | Payer: BLUE CROSS/BLUE SHIELD

## 2020-02-29 DIAGNOSIS — G894 Chronic pain syndrome: Principal | ICD-10-CM

## 2020-02-29 DIAGNOSIS — M47817 Spondylosis without myelopathy or radiculopathy, lumbosacral region: Principal | ICD-10-CM

## 2020-02-29 DIAGNOSIS — Z981 Arthrodesis status: Principal | ICD-10-CM

## 2020-02-29 MED ORDER — NALOXEGOL 25 MG TABLET
ORAL_TABLET | Freq: Every day | ORAL | 5 refills | 90 days | Status: CP
Start: 2020-02-29 — End: 2020-08-27

## 2020-03-04 ENCOUNTER — Ambulatory Visit: Admit: 2020-03-04 | Discharge: 2020-03-05 | Payer: BLUE CROSS/BLUE SHIELD

## 2020-03-04 DIAGNOSIS — M5416 Radiculopathy, lumbar region: Principal | ICD-10-CM

## 2020-03-04 DIAGNOSIS — G894 Chronic pain syndrome: Principal | ICD-10-CM

## 2020-03-04 DIAGNOSIS — M47817 Spondylosis without myelopathy or radiculopathy, lumbosacral region: Principal | ICD-10-CM

## 2020-03-11 DIAGNOSIS — M5416 Radiculopathy, lumbar region: Principal | ICD-10-CM

## 2020-03-11 DIAGNOSIS — Z981 Arthrodesis status: Principal | ICD-10-CM

## 2020-03-14 ENCOUNTER — Ambulatory Visit: Admit: 2020-03-14 | Discharge: 2020-03-15 | Payer: BLUE CROSS/BLUE SHIELD

## 2020-03-14 DIAGNOSIS — Z981 Arthrodesis status: Principal | ICD-10-CM

## 2020-03-14 DIAGNOSIS — M5416 Radiculopathy, lumbar region: Principal | ICD-10-CM

## 2020-03-16 MED ORDER — MORPHINE ER 30 MG TABLET,EXTENDED RELEASE
ORAL_TABLET | Freq: Two times a day (BID) | ORAL | 0 refills | 30 days | Status: CP
Start: 2020-03-16 — End: 2020-04-16

## 2020-03-16 MED ORDER — OXYCODONE-ACETAMINOPHEN 10 MG-325 MG TABLET
ORAL_TABLET | Freq: Three times a day (TID) | ORAL | 0 refills | 30 days | Status: CP | PRN
Start: 2020-03-16 — End: 2020-04-16

## 2020-04-16 MED ORDER — MORPHINE ER 30 MG TABLET,EXTENDED RELEASE
ORAL_TABLET | Freq: Two times a day (BID) | ORAL | 0 refills | 30.00000 days | Status: CP
Start: 2020-04-16 — End: 2020-05-16

## 2020-04-16 MED ORDER — OXYCODONE-ACETAMINOPHEN 10 MG-325 MG TABLET
ORAL_TABLET | Freq: Three times a day (TID) | ORAL | 0 refills | 30.00000 days | Status: CP | PRN
Start: 2020-04-16 — End: 2020-05-16

## 2020-04-25 ENCOUNTER — Ambulatory Visit: Admit: 2020-04-25 | Discharge: 2020-04-26 | Payer: BLUE CROSS/BLUE SHIELD

## 2020-04-25 DIAGNOSIS — G894 Chronic pain syndrome: Principal | ICD-10-CM

## 2020-04-25 DIAGNOSIS — Z981 Arthrodesis status: Principal | ICD-10-CM

## 2020-04-25 DIAGNOSIS — M5416 Radiculopathy, lumbar region: Principal | ICD-10-CM

## 2020-04-25 DIAGNOSIS — M47817 Spondylosis without myelopathy or radiculopathy, lumbosacral region: Principal | ICD-10-CM

## 2020-04-25 MED ORDER — PREGABALIN 75 MG CAPSULE
ORAL_CAPSULE | Freq: Three times a day (TID) | ORAL | 1 refills | 30.00000 days | Status: CP
Start: 2020-04-25 — End: 2020-06-24

## 2020-04-25 MED ORDER — PROMETHAZINE 12.5 MG TABLET
ORAL_TABLET | Freq: Two times a day (BID) | ORAL | 0 refills | 15.00000 days | Status: CP | PRN
Start: 2020-04-25 — End: 2020-05-25

## 2020-05-16 MED ORDER — OXYCODONE-ACETAMINOPHEN 10 MG-325 MG TABLET
ORAL_TABLET | Freq: Three times a day (TID) | ORAL | 0 refills | 30 days | Status: CP | PRN
Start: 2020-05-16 — End: 2020-06-15

## 2020-05-16 MED ORDER — OXYCODONE ER 20 MG TABLET,CRUSH RESISTANT,EXTENDED RELEASE 12 HR
ORAL_TABLET | Freq: Two times a day (BID) | ORAL | 0 refills | 30.00000 days | Status: CP
Start: 2020-05-16 — End: 2020-06-15

## 2020-06-13 ENCOUNTER — Ambulatory Visit: Admit: 2020-06-13 | Discharge: 2020-06-13 | Payer: BLUE CROSS/BLUE SHIELD

## 2020-06-13 DIAGNOSIS — M5416 Radiculopathy, lumbar region: Principal | ICD-10-CM

## 2020-06-13 DIAGNOSIS — Z981 Arthrodesis status: Principal | ICD-10-CM

## 2020-06-13 DIAGNOSIS — G894 Chronic pain syndrome: Principal | ICD-10-CM

## 2020-06-13 MED ORDER — PREGABALIN 100 MG CAPSULE
ORAL_CAPSULE | Freq: Three times a day (TID) | ORAL | 5 refills | 30.00000 days | Status: CP
Start: 2020-06-13 — End: 2020-12-10

## 2020-06-15 MED ORDER — OXYCODONE ER 30 MG TABLET,CRUSH RESISTANT,EXTENDED RELEASE 12 HR
ORAL_TABLET | Freq: Two times a day (BID) | ORAL | 0 refills | 30.00000 days | Status: CP
Start: 2020-06-15 — End: 2020-07-15

## 2020-06-15 MED ORDER — OXYCODONE-ACETAMINOPHEN 10 MG-325 MG TABLET
ORAL_TABLET | Freq: Two times a day (BID) | ORAL | 0 refills | 30.00000 days | Status: CP | PRN
Start: 2020-06-15 — End: 2020-07-15

## 2020-06-24 DIAGNOSIS — R251 Tremor, unspecified: Principal | ICD-10-CM

## 2020-06-24 DIAGNOSIS — R42 Dizziness and giddiness: Principal | ICD-10-CM

## 2020-07-06 MED ORDER — OXYCODONE-ACETAMINOPHEN 10 MG-325 MG TABLET
ORAL_TABLET | Freq: Two times a day (BID) | ORAL | 0 refills | 4.00000 days | Status: CP | PRN
Start: 2020-07-06 — End: 2020-07-10

## 2020-07-10 ENCOUNTER — Ambulatory Visit: Admit: 2020-07-10 | Discharge: 2020-07-11 | Payer: BLUE CROSS/BLUE SHIELD

## 2020-07-10 DIAGNOSIS — T402X5A Adverse effect of other opioids, initial encounter: Principal | ICD-10-CM

## 2020-07-10 DIAGNOSIS — K5903 Drug induced constipation: Principal | ICD-10-CM

## 2020-07-10 DIAGNOSIS — M792 Neuralgia and neuritis, unspecified: Principal | ICD-10-CM

## 2020-07-10 DIAGNOSIS — G894 Chronic pain syndrome: Principal | ICD-10-CM

## 2020-07-10 DIAGNOSIS — M47817 Spondylosis without myelopathy or radiculopathy, lumbosacral region: Principal | ICD-10-CM

## 2020-07-10 DIAGNOSIS — Z981 Arthrodesis status: Principal | ICD-10-CM

## 2020-07-10 MED ORDER — METHYLNALTREXONE 150 MG TABLET
ORAL_TABLET | Freq: Every day | ORAL | 0 refills | 30.00000 days | Status: CP
Start: 2020-07-10 — End: 2020-08-09

## 2020-07-10 MED ORDER — OXYCODONE-ACETAMINOPHEN 10 MG-325 MG TABLET
ORAL_TABLET | Freq: Three times a day (TID) | ORAL | 0 refills | 30.00000 days | Status: CP | PRN
Start: 2020-07-10 — End: 2020-08-09

## 2020-07-12 MED ORDER — FENTANYL 25 MCG/HR TRANSDERMAL PATCH
MEDICATED_PATCH | TRANSDERMAL | 0 refills | 30.00000 days | Status: CP
Start: 2020-07-12 — End: 2020-08-11

## 2020-07-15 DIAGNOSIS — Z981 Arthrodesis status: Principal | ICD-10-CM

## 2020-07-15 DIAGNOSIS — G894 Chronic pain syndrome: Principal | ICD-10-CM

## 2020-07-15 DIAGNOSIS — M5416 Radiculopathy, lumbar region: Principal | ICD-10-CM

## 2020-07-15 MED ORDER — OXYCODONE ER 20 MG TABLET,CRUSH RESISTANT,EXTENDED RELEASE 12 HR
ORAL_TABLET | Freq: Two times a day (BID) | ORAL | 0 refills | 7 days | Status: CP
Start: 2020-07-15 — End: 2020-07-22

## 2020-07-23 ENCOUNTER — Encounter
Admit: 2020-07-23 | Discharge: 2020-07-24 | Payer: BLUE CROSS/BLUE SHIELD | Attending: Cardiovascular Disease | Primary: Cardiovascular Disease

## 2020-07-23 DIAGNOSIS — E139 Other specified diabetes mellitus without complications: Principal | ICD-10-CM

## 2020-07-23 DIAGNOSIS — M79604 Pain in right leg: Principal | ICD-10-CM

## 2020-07-23 DIAGNOSIS — R079 Chest pain, unspecified: Principal | ICD-10-CM

## 2020-07-23 DIAGNOSIS — R002 Palpitations: Principal | ICD-10-CM

## 2020-07-23 DIAGNOSIS — R55 Syncope and collapse: Principal | ICD-10-CM

## 2020-07-23 DIAGNOSIS — E782 Mixed hyperlipidemia: Principal | ICD-10-CM

## 2020-07-23 DIAGNOSIS — R0989 Other specified symptoms and signs involving the circulatory and respiratory systems: Principal | ICD-10-CM

## 2020-07-23 DIAGNOSIS — M79605 Pain in left leg: Principal | ICD-10-CM

## 2020-07-23 DIAGNOSIS — R0602 Shortness of breath: Principal | ICD-10-CM

## 2020-07-31 ENCOUNTER — Encounter: Admit: 2020-07-31 | Discharge: 2020-08-01 | Payer: BLUE CROSS/BLUE SHIELD

## 2020-07-31 DIAGNOSIS — F1190 Opioid use, unspecified, uncomplicated: Principal | ICD-10-CM

## 2020-07-31 DIAGNOSIS — M47817 Spondylosis without myelopathy or radiculopathy, lumbosacral region: Principal | ICD-10-CM

## 2020-07-31 DIAGNOSIS — M47816 Spondylosis without myelopathy or radiculopathy, lumbar region: Principal | ICD-10-CM

## 2020-07-31 DIAGNOSIS — Z79891 Long term (current) use of opiate analgesic: Principal | ICD-10-CM

## 2020-07-31 DIAGNOSIS — M4727 Other spondylosis with radiculopathy, lumbosacral region: Principal | ICD-10-CM

## 2020-07-31 DIAGNOSIS — F39 Unspecified mood [affective] disorder: Principal | ICD-10-CM

## 2020-07-31 DIAGNOSIS — F4310 Post-traumatic stress disorder, unspecified: Principal | ICD-10-CM

## 2020-07-31 DIAGNOSIS — M4726 Other spondylosis with radiculopathy, lumbar region: Principal | ICD-10-CM

## 2020-07-31 DIAGNOSIS — M4316 Spondylolisthesis, lumbar region: Principal | ICD-10-CM

## 2020-07-31 DIAGNOSIS — M5416 Radiculopathy, lumbar region: Principal | ICD-10-CM

## 2020-07-31 DIAGNOSIS — G894 Chronic pain syndrome: Principal | ICD-10-CM

## 2020-07-31 MED ORDER — FENTANYL 50 MCG/HR TRANSDERMAL PATCH
MEDICATED_PATCH | TRANSDERMAL | 0 refills | 30 days | Status: CP
Start: 2020-07-31 — End: 2020-09-26

## 2020-08-01 ENCOUNTER — Encounter: Admit: 2020-08-01 | Discharge: 2020-08-02 | Payer: BLUE CROSS/BLUE SHIELD

## 2020-08-01 DIAGNOSIS — R0602 Shortness of breath: Principal | ICD-10-CM

## 2020-08-01 DIAGNOSIS — E139 Other specified diabetes mellitus without complications: Principal | ICD-10-CM

## 2020-08-01 DIAGNOSIS — R002 Palpitations: Principal | ICD-10-CM

## 2020-08-01 DIAGNOSIS — R079 Chest pain, unspecified: Principal | ICD-10-CM

## 2020-08-01 DIAGNOSIS — R0989 Other specified symptoms and signs involving the circulatory and respiratory systems: Principal | ICD-10-CM

## 2020-08-12 ENCOUNTER — Ambulatory Visit: Admit: 2020-08-12 | Discharge: 2020-08-13 | Payer: BLUE CROSS/BLUE SHIELD

## 2020-08-12 DIAGNOSIS — R0989 Other specified symptoms and signs involving the circulatory and respiratory systems: Principal | ICD-10-CM

## 2020-08-12 DIAGNOSIS — M79604 Pain in right leg: Principal | ICD-10-CM

## 2020-08-12 DIAGNOSIS — M79605 Pain in left leg: Principal | ICD-10-CM

## 2020-08-14 ENCOUNTER — Ambulatory Visit: Admit: 2020-08-14 | Discharge: 2020-08-15 | Payer: BLUE CROSS/BLUE SHIELD

## 2020-08-14 DIAGNOSIS — R0602 Shortness of breath: Principal | ICD-10-CM

## 2020-08-14 DIAGNOSIS — E139 Other specified diabetes mellitus without complications: Principal | ICD-10-CM

## 2020-08-14 DIAGNOSIS — R002 Palpitations: Principal | ICD-10-CM

## 2020-08-14 DIAGNOSIS — R079 Chest pain, unspecified: Principal | ICD-10-CM

## 2020-08-20 ENCOUNTER — Encounter
Admit: 2020-08-20 | Discharge: 2020-08-21 | Payer: BLUE CROSS/BLUE SHIELD | Attending: Family Medicine | Primary: Family Medicine

## 2020-08-20 DIAGNOSIS — I209 Angina pectoris, unspecified: Principal | ICD-10-CM

## 2020-08-20 DIAGNOSIS — Z01818 Encounter for other preprocedural examination: Principal | ICD-10-CM

## 2020-08-20 DIAGNOSIS — E119 Type 2 diabetes mellitus without complications: Principal | ICD-10-CM

## 2020-08-20 DIAGNOSIS — R9439 Abnormal result of other cardiovascular function study: Principal | ICD-10-CM

## 2020-08-20 DIAGNOSIS — R0602 Shortness of breath: Principal | ICD-10-CM

## 2020-08-20 DIAGNOSIS — Z01812 Encounter for preprocedural laboratory examination: Principal | ICD-10-CM

## 2020-08-20 DIAGNOSIS — R079 Chest pain, unspecified: Principal | ICD-10-CM

## 2020-08-20 DIAGNOSIS — Z87891 Personal history of nicotine dependence: Principal | ICD-10-CM

## 2020-08-20 DIAGNOSIS — E785 Hyperlipidemia, unspecified: Principal | ICD-10-CM

## 2020-08-20 DIAGNOSIS — Z20822 Contact with and (suspected) exposure to covid-19: Principal | ICD-10-CM

## 2020-08-22 DIAGNOSIS — Z01818 Encounter for other preprocedural examination: Principal | ICD-10-CM

## 2020-08-23 MED ORDER — FENTANYL 25 MCG/HR TRANSDERMAL PATCH
MEDICATED_PATCH | TRANSDERMAL | 0 refills | 30.00000 days | Status: CP
Start: 2020-08-23 — End: 2020-08-29

## 2020-08-23 MED ORDER — OXYCODONE 15 MG TABLET
ORAL_TABLET | Freq: Three times a day (TID) | ORAL | 0 refills | 30.00000 days | Status: CP | PRN
Start: 2020-08-23 — End: 2020-09-22

## 2020-08-27 ENCOUNTER — Encounter: Admit: 2020-08-27 | Discharge: 2020-08-27 | Payer: BLUE CROSS/BLUE SHIELD | Attending: Surgical | Primary: Surgical

## 2020-08-27 ENCOUNTER — Encounter: Admit: 2020-08-27 | Discharge: 2020-08-27 | Payer: PRIVATE HEALTH INSURANCE

## 2020-08-27 DIAGNOSIS — Z01818 Encounter for other preprocedural examination: Principal | ICD-10-CM

## 2020-08-27 DIAGNOSIS — R9439 Abnormal result of other cardiovascular function study: Principal | ICD-10-CM

## 2020-08-27 DIAGNOSIS — R079 Chest pain, unspecified: Principal | ICD-10-CM

## 2020-08-27 DIAGNOSIS — R0602 Shortness of breath: Principal | ICD-10-CM

## 2020-08-28 DIAGNOSIS — R42 Dizziness and giddiness: Principal | ICD-10-CM

## 2020-08-28 DIAGNOSIS — R251 Tremor, unspecified: Principal | ICD-10-CM

## 2020-08-29 ENCOUNTER — Encounter: Admit: 2020-08-29 | Discharge: 2020-08-29 | Payer: BLUE CROSS/BLUE SHIELD

## 2020-08-29 DIAGNOSIS — M47817 Spondylosis without myelopathy or radiculopathy, lumbosacral region: Principal | ICD-10-CM

## 2020-08-29 DIAGNOSIS — M792 Neuralgia and neuritis, unspecified: Principal | ICD-10-CM

## 2020-08-29 DIAGNOSIS — M5416 Radiculopathy, lumbar region: Principal | ICD-10-CM

## 2020-08-29 DIAGNOSIS — G894 Chronic pain syndrome: Principal | ICD-10-CM

## 2020-08-29 DIAGNOSIS — Z981 Arthrodesis status: Principal | ICD-10-CM

## 2020-08-29 DIAGNOSIS — Z79899 Other long term (current) drug therapy: Principal | ICD-10-CM

## 2020-08-29 DIAGNOSIS — G629 Polyneuropathy, unspecified: Principal | ICD-10-CM

## 2020-08-29 DIAGNOSIS — Z87891 Personal history of nicotine dependence: Principal | ICD-10-CM

## 2020-08-29 MED ORDER — DULOXETINE 30 MG CAPSULE,DELAYED RELEASE
ORAL_CAPSULE | 0 refills | 0 days | Status: CP
Start: 2020-08-29 — End: 2020-09-06

## 2020-09-02 DIAGNOSIS — Z981 Arthrodesis status: Principal | ICD-10-CM

## 2020-09-02 DIAGNOSIS — M5416 Radiculopathy, lumbar region: Principal | ICD-10-CM

## 2020-09-02 DIAGNOSIS — G894 Chronic pain syndrome: Principal | ICD-10-CM

## 2020-09-02 MED ORDER — OXYCODONE-ACETAMINOPHEN 10 MG-325 MG TABLET: 1 | tablet | Freq: Three times a day (TID) | 0 refills | 30 days | Status: AC

## 2020-09-04 MED ORDER — OXYCODONE ER 20 MG TABLET,CRUSH RESISTANT,EXTENDED RELEASE 12 HR
ORAL_TABLET | Freq: Two times a day (BID) | ORAL | 0 refills | 30.00000 days | Status: CP
Start: 2020-09-04 — End: 2020-09-26

## 2020-09-06 MED ORDER — DULOXETINE 30 MG CAPSULE,DELAYED RELEASE
ORAL_CAPSULE | 0 refills | 0 days | Status: CP
Start: 2020-09-06 — End: ?

## 2020-09-09 ENCOUNTER — Encounter: Admit: 2020-09-09 | Discharge: 2020-09-10 | Payer: BLUE CROSS/BLUE SHIELD

## 2020-09-09 DIAGNOSIS — Z01812 Encounter for preprocedural laboratory examination: Principal | ICD-10-CM

## 2020-09-16 MED ORDER — DULOXETINE 30 MG CAPSULE,DELAYED RELEASE
ORAL_CAPSULE | Freq: Two times a day (BID) | ORAL | 0 refills | 30.00000 days | Status: CP
Start: 2020-09-16 — End: 2020-11-12

## 2020-09-26 ENCOUNTER — Encounter: Admit: 2020-09-26 | Discharge: 2020-09-27 | Payer: BLUE CROSS/BLUE SHIELD

## 2020-09-26 DIAGNOSIS — M5412 Radiculopathy, cervical region: Principal | ICD-10-CM

## 2020-09-26 DIAGNOSIS — M4726 Other spondylosis with radiculopathy, lumbar region: Principal | ICD-10-CM

## 2020-09-26 DIAGNOSIS — M47816 Spondylosis without myelopathy or radiculopathy, lumbar region: Principal | ICD-10-CM

## 2020-09-26 DIAGNOSIS — M5416 Radiculopathy, lumbar region: Principal | ICD-10-CM

## 2020-09-26 DIAGNOSIS — M4316 Spondylolisthesis, lumbar region: Principal | ICD-10-CM

## 2020-09-26 DIAGNOSIS — Z981 Arthrodesis status: Principal | ICD-10-CM

## 2020-09-26 DIAGNOSIS — G894 Chronic pain syndrome: Principal | ICD-10-CM

## 2020-09-26 DIAGNOSIS — Z5181 Encounter for therapeutic drug level monitoring: Principal | ICD-10-CM

## 2020-09-26 DIAGNOSIS — R251 Tremor, unspecified: Principal | ICD-10-CM

## 2020-09-26 DIAGNOSIS — M7060 Trochanteric bursitis, unspecified hip: Principal | ICD-10-CM

## 2020-09-26 DIAGNOSIS — F1190 Opioid use, unspecified, uncomplicated: Principal | ICD-10-CM

## 2020-09-26 DIAGNOSIS — M47817 Spondylosis without myelopathy or radiculopathy, lumbosacral region: Principal | ICD-10-CM

## 2020-09-26 DIAGNOSIS — M461 Sacroiliitis, not elsewhere classified: Principal | ICD-10-CM

## 2020-09-26 DIAGNOSIS — K5903 Drug induced constipation: Principal | ICD-10-CM

## 2020-09-26 DIAGNOSIS — Z79891 Long term (current) use of opiate analgesic: Principal | ICD-10-CM

## 2020-09-26 DIAGNOSIS — T402X5A Adverse effect of other opioids, initial encounter: Principal | ICD-10-CM

## 2020-09-26 DIAGNOSIS — R42 Dizziness and giddiness: Principal | ICD-10-CM

## 2020-09-26 DIAGNOSIS — G8929 Other chronic pain: Principal | ICD-10-CM

## 2020-09-26 DIAGNOSIS — M792 Neuralgia and neuritis, unspecified: Principal | ICD-10-CM

## 2020-09-26 DIAGNOSIS — M545 Low back pain, unspecified: Principal | ICD-10-CM

## 2020-09-26 DIAGNOSIS — G629 Polyneuropathy, unspecified: Principal | ICD-10-CM

## 2020-09-26 DIAGNOSIS — F32A Depression, unspecified: Principal | ICD-10-CM

## 2020-10-02 MED ORDER — OXYCODONE ER 20 MG TABLET,CRUSH RESISTANT,EXTENDED RELEASE 12 HR
ORAL_TABLET | Freq: Two times a day (BID) | ORAL | 0 refills | 30.00000 days | Status: CP
Start: 2020-10-02 — End: 2020-09-04

## 2020-10-02 MED ORDER — OXYCODONE-ACETAMINOPHEN 10 MG-325 MG TABLET
ORAL_TABLET | Freq: Three times a day (TID) | ORAL | 0 refills | 30.00000 days | Status: CP | PRN
Start: 2020-10-02 — End: 2020-09-26

## 2020-10-09 MED ORDER — MORPHINE ER 30 MG TABLET,EXTENDED RELEASE
ORAL_TABLET | Freq: Two times a day (BID) | ORAL | 0 refills | 24 days | Status: CP
Start: 2020-10-09 — End: 2020-11-02

## 2020-11-01 MED ORDER — OXYCODONE-ACETAMINOPHEN 10 MG-325 MG TABLET
ORAL_TABLET | Freq: Three times a day (TID) | ORAL | 0 refills | 30 days | Status: CP | PRN
Start: 2020-11-01 — End: 2020-12-01

## 2020-11-01 MED ORDER — MORPHINE ER 30 MG TABLET,EXTENDED RELEASE
ORAL_TABLET | Freq: Two times a day (BID) | ORAL | 0 refills | 30.00000 days | Status: CP
Start: 2020-11-01 — End: 2020-12-01

## 2020-11-12 MED ORDER — DULOXETINE 30 MG CAPSULE,DELAYED RELEASE
ORAL_CAPSULE | 0 refills | 0 days | Status: CP
Start: 2020-11-12 — End: ?

## 2020-11-25 ENCOUNTER — Encounter: Admit: 2020-11-25 | Discharge: 2020-11-26 | Payer: BLUE CROSS/BLUE SHIELD

## 2020-11-25 DIAGNOSIS — M792 Neuralgia and neuritis, unspecified: Principal | ICD-10-CM

## 2020-11-25 DIAGNOSIS — M47816 Spondylosis without myelopathy or radiculopathy, lumbar region: Principal | ICD-10-CM

## 2020-11-25 DIAGNOSIS — G894 Chronic pain syndrome: Principal | ICD-10-CM

## 2020-11-25 DIAGNOSIS — M5416 Radiculopathy, lumbar region: Principal | ICD-10-CM

## 2020-11-25 MED ORDER — DULOXETINE 30 MG CAPSULE,DELAYED RELEASE
ORAL_CAPSULE | Freq: Two times a day (BID) | ORAL | 5 refills | 30 days | Status: CP
Start: 2020-11-25 — End: 2021-05-24

## 2020-11-30 MED ORDER — OXYCODONE-ACETAMINOPHEN 10 MG-325 MG TABLET
ORAL_TABLET | Freq: Three times a day (TID) | ORAL | 0 refills | 30 days | Status: CP | PRN
Start: 2020-11-30 — End: 2020-12-31

## 2020-11-30 MED ORDER — MORPHINE ER 30 MG TABLET,EXTENDED RELEASE
ORAL_TABLET | Freq: Two times a day (BID) | ORAL | 0 refills | 30 days | Status: CP
Start: 2020-11-30 — End: 2020-12-31

## 2020-12-31 MED ORDER — MORPHINE ER 30 MG TABLET,EXTENDED RELEASE
ORAL_TABLET | Freq: Two times a day (BID) | ORAL | 0 refills | 30 days | Status: CP
Start: 2020-12-31 — End: 2021-01-31

## 2020-12-31 MED ORDER — OXYCODONE-ACETAMINOPHEN 10 MG-325 MG TABLET
ORAL_TABLET | Freq: Three times a day (TID) | ORAL | 0 refills | 30 days | Status: CP | PRN
Start: 2020-12-31 — End: 2021-01-30

## 2021-01-17 ENCOUNTER — Encounter: Admit: 2021-01-17 | Discharge: 2021-01-17 | Payer: BLUE CROSS/BLUE SHIELD

## 2021-01-17 ENCOUNTER — Encounter: Admit: 2021-01-17 | Discharge: 2021-01-17 | Payer: BLUE CROSS/BLUE SHIELD | Attending: Medical | Primary: Medical

## 2021-01-17 DIAGNOSIS — R5383 Other fatigue: Principal | ICD-10-CM

## 2021-01-17 DIAGNOSIS — R000 Tachycardia, unspecified: Principal | ICD-10-CM

## 2021-01-17 DIAGNOSIS — R55 Syncope and collapse: Principal | ICD-10-CM

## 2021-01-17 DIAGNOSIS — R0602 Shortness of breath: Principal | ICD-10-CM

## 2021-01-17 MED ORDER — ATORVASTATIN 40 MG TABLET
ORAL_TABLET | Freq: Every day | ORAL | 1 refills | 90 days | Status: CP
Start: 2021-01-17 — End: ?

## 2021-01-20 ENCOUNTER — Ambulatory Visit: Admit: 2021-01-20 | Discharge: 2021-01-20 | Payer: BLUE CROSS/BLUE SHIELD

## 2021-01-20 DIAGNOSIS — G894 Chronic pain syndrome: Principal | ICD-10-CM

## 2021-01-20 DIAGNOSIS — M792 Neuralgia and neuritis, unspecified: Principal | ICD-10-CM

## 2021-01-20 DIAGNOSIS — M5416 Radiculopathy, lumbar region: Principal | ICD-10-CM

## 2021-01-20 DIAGNOSIS — M47816 Spondylosis without myelopathy or radiculopathy, lumbar region: Principal | ICD-10-CM

## 2021-01-20 MED ORDER — DULOXETINE 60 MG CAPSULE,DELAYED RELEASE
ORAL_CAPSULE | Freq: Every day | ORAL | 1 refills | 90.00000 days | Status: CP
Start: 2021-01-20 — End: 2021-07-19

## 2021-01-24 ENCOUNTER — Encounter: Admit: 2021-01-24 | Discharge: 2021-01-25 | Payer: BLUE CROSS/BLUE SHIELD

## 2021-01-30 MED ORDER — OXYCODONE-ACETAMINOPHEN 10 MG-325 MG TABLET
ORAL_TABLET | Freq: Three times a day (TID) | ORAL | 0 refills | 30.00000 days | Status: CP | PRN
Start: 2021-01-30 — End: 2021-03-01

## 2021-01-30 MED ORDER — MORPHINE ER 30 MG TABLET,EXTENDED RELEASE
ORAL_TABLET | Freq: Two times a day (BID) | ORAL | 0 refills | 30 days | Status: CP
Start: 2021-01-30 — End: 2021-03-01

## 2021-02-04 MED ORDER — METOPROLOL SUCCINATE ER 25 MG TABLET,EXTENDED RELEASE 24 HR
ORAL_TABLET | Freq: Every day | ORAL | 6 refills | 30 days | Status: CP
Start: 2021-02-04 — End: ?

## 2021-03-01 MED ORDER — MORPHINE ER 30 MG TABLET,EXTENDED RELEASE
ORAL_TABLET | Freq: Two times a day (BID) | ORAL | 0 refills | 30 days | Status: CP
Start: 2021-03-01 — End: 2021-03-31

## 2021-03-01 MED ORDER — OXYCODONE-ACETAMINOPHEN 10 MG-325 MG TABLET
ORAL_TABLET | Freq: Three times a day (TID) | ORAL | 0 refills | 30 days | Status: CP | PRN
Start: 2021-03-01 — End: 2021-03-31

## 2021-03-17 ENCOUNTER — Encounter: Admit: 2021-03-17 | Discharge: 2021-03-18 | Payer: PRIVATE HEALTH INSURANCE

## 2021-03-17 ENCOUNTER — Ambulatory Visit
Admit: 2021-03-17 | Discharge: 2021-03-17 | Disposition: A | Discharge status: Home / Self Care | Payer: PRIVATE HEALTH INSURANCE

## 2021-03-17 DIAGNOSIS — N4833 Priapism, drug-induced: Principal | ICD-10-CM

## 2021-03-17 DIAGNOSIS — M47816 Spondylosis without myelopathy or radiculopathy, lumbar region: Principal | ICD-10-CM

## 2021-03-17 DIAGNOSIS — M5416 Radiculopathy, lumbar region: Principal | ICD-10-CM

## 2021-03-17 DIAGNOSIS — G894 Chronic pain syndrome: Principal | ICD-10-CM

## 2021-03-17 DIAGNOSIS — Z981 Arthrodesis status: Principal | ICD-10-CM

## 2021-03-17 DIAGNOSIS — M792 Neuralgia and neuritis, unspecified: Principal | ICD-10-CM

## 2021-03-29 MED ORDER — OXYCODONE ER 20 MG TABLET,CRUSH RESISTANT,EXTENDED RELEASE 12 HR
ORAL_TABLET | Freq: Two times a day (BID) | ORAL | 0 refills | 30 days | Status: CP
Start: 2021-03-29 — End: 2021-04-28

## 2021-03-29 MED ORDER — OXYCODONE-ACETAMINOPHEN 10 MG-325 MG TABLET
ORAL_TABLET | Freq: Three times a day (TID) | ORAL | 0 refills | 30 days | Status: CP | PRN
Start: 2021-03-29 — End: 2021-04-28

## 2021-04-03 ENCOUNTER — Encounter: Admit: 2021-04-03 | Discharge: 2021-04-03 | Payer: BLUE CROSS/BLUE SHIELD

## 2021-04-15 ENCOUNTER — Encounter: Admit: 2021-04-15 | Discharge: 2021-04-16 | Payer: BLUE CROSS/BLUE SHIELD

## 2021-04-15 ENCOUNTER — Ambulatory Visit: Admit: 2021-04-15 | Discharge: 2021-04-16 | Payer: BLUE CROSS/BLUE SHIELD

## 2021-04-28 MED ORDER — OXYCODONE-ACETAMINOPHEN 10 MG-325 MG TABLET
ORAL_TABLET | Freq: Three times a day (TID) | ORAL | 0 refills | 30 days | Status: CP | PRN
Start: 2021-04-28 — End: 2021-05-28

## 2021-04-28 MED ORDER — OXYCODONE ER 20 MG TABLET,CRUSH RESISTANT,EXTENDED RELEASE 12 HR
ORAL_TABLET | Freq: Two times a day (BID) | ORAL | 0 refills | 30 days | Status: CP
Start: 2021-04-28 — End: 2021-05-28

## 2021-05-01 ENCOUNTER — Encounter: Admit: 2021-05-01 | Discharge: 2021-05-02 | Payer: PRIVATE HEALTH INSURANCE

## 2021-05-02 DIAGNOSIS — M792 Neuralgia and neuritis, unspecified: Principal | ICD-10-CM

## 2021-05-12 ENCOUNTER — Ambulatory Visit: Admit: 2021-05-12 | Discharge: 2021-05-13 | Payer: PRIVATE HEALTH INSURANCE

## 2021-05-12 DIAGNOSIS — G894 Chronic pain syndrome: Principal | ICD-10-CM

## 2021-05-12 DIAGNOSIS — M792 Neuralgia and neuritis, unspecified: Principal | ICD-10-CM

## 2021-05-12 DIAGNOSIS — M5416 Radiculopathy, lumbar region: Principal | ICD-10-CM

## 2021-05-12 DIAGNOSIS — M47816 Spondylosis without myelopathy or radiculopathy, lumbar region: Principal | ICD-10-CM

## 2021-05-12 MED ORDER — PREGABALIN 75 MG CAPSULE
ORAL_CAPSULE | Freq: Three times a day (TID) | ORAL | 1 refills | 30 days | Status: CP
Start: 2021-05-12 — End: 2021-07-11

## 2021-05-22 IMAGING — MR MRI LEFT SHOULDER WITHOUT CONTRAST
5 of 7 series · 23 of 40 positions shown · IV contrast (gadolinium)
Comparison: None

________________________________________________________________________________________________ 
MRI LEFT SHOULDER WITHOUT CONTRAST, 05/22/2021 [DATE]: 
CLINICAL INDICATION: Left shoulder pain for 3 weeks after lifting type injury.
TECHNIQUE: Multiplanar, multiecho position MR images of the shoulder were 
performed without intravenous gadolinium enhancement. Patient was scanned on a 
1.5T magnet.

[Series 101: survey_fullfov_transversal · axial · 10.0mm · 1.84mm/px · 1 of 7 slices shown]
[im 1/7]
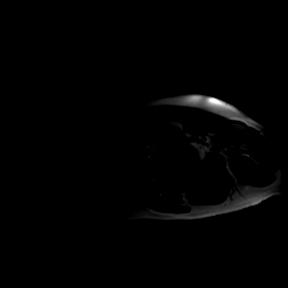

[Series 201: survey mst · axial · 10.0mm · 0.99mm/px · z∈[-40,+175]mm · 4 of 15 slices shown]
[im 1/15]
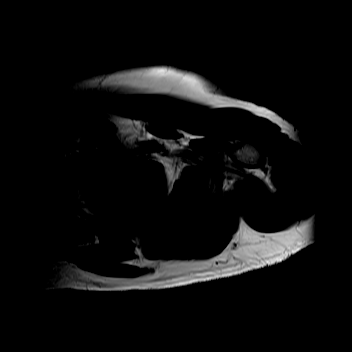
[im 5/15]
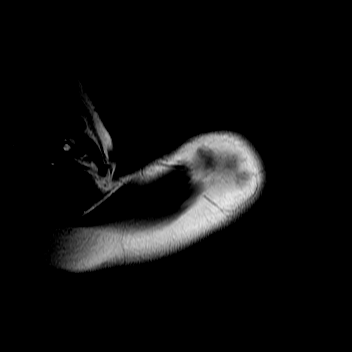
[im 10/15]
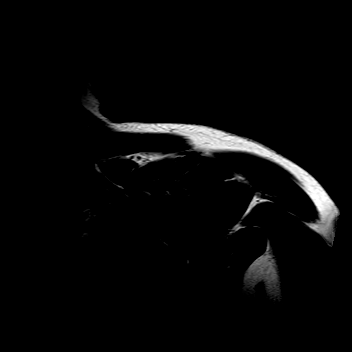
[im 15/15]
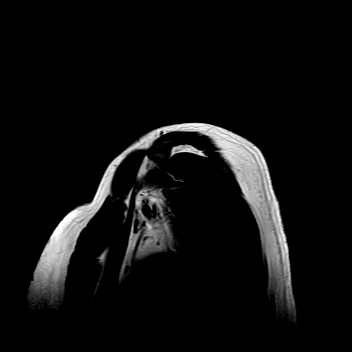

[Series 301: (person_name)_(person_name)_(person_name)* · axial · 3.0mm · 0.35mm/px · z∈[-72,+15]mm · 7 of 28 slices shown]
[im 1/28]
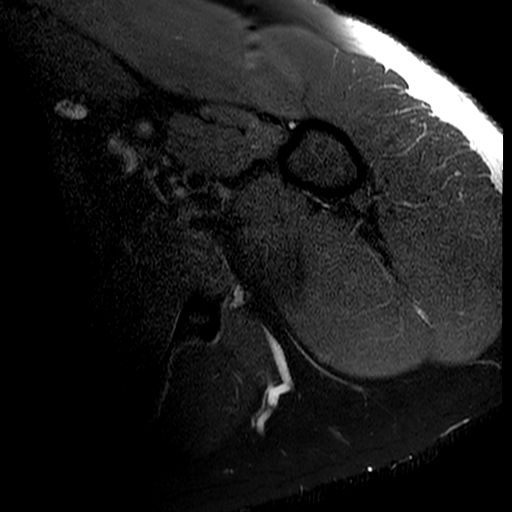
[im 5/28]
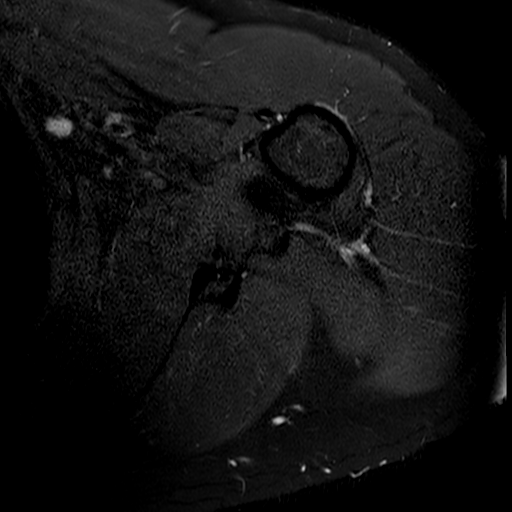
[im 10/28]
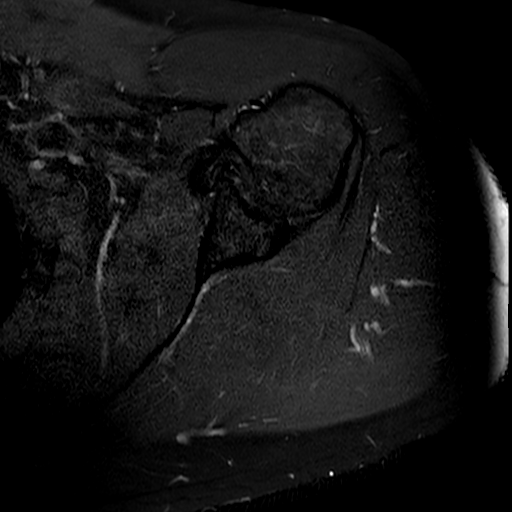
[im 14/28]
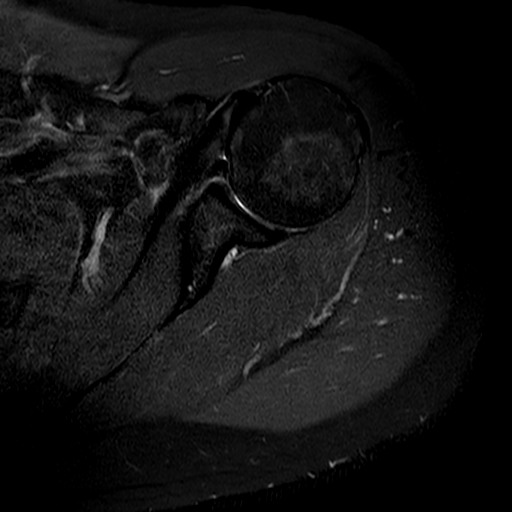
[im 19/28]
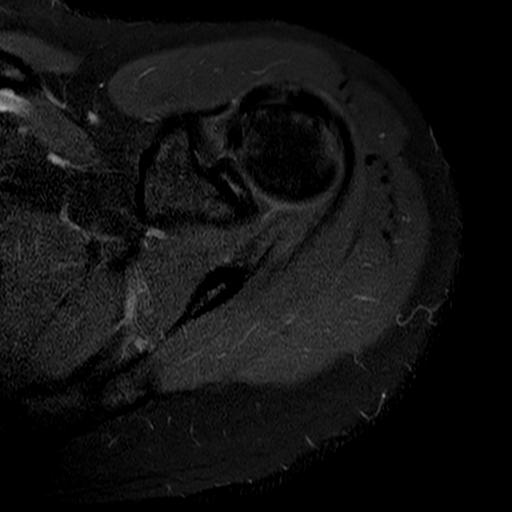
[im 23/28]
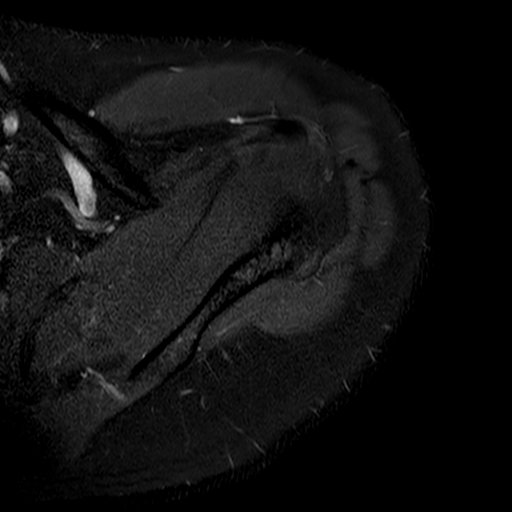
[im 28/28]
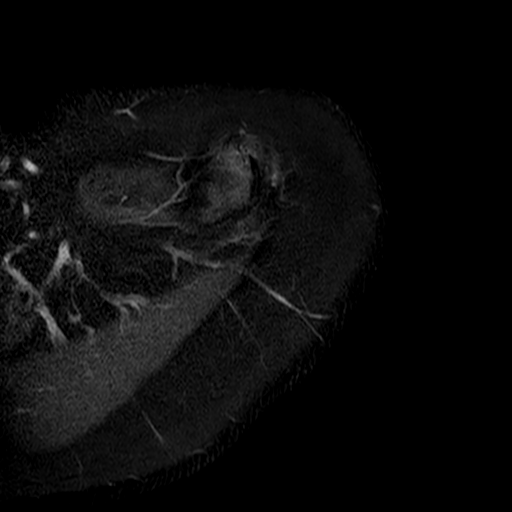

[Series 401: t2_fs_sag* · coronal · 3.0mm · 0.37mm/px · 8 of 30 slices shown]
[im 1/30]
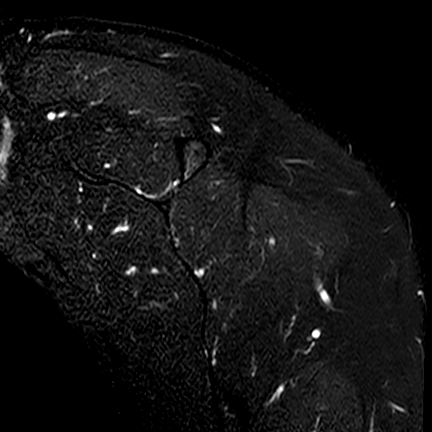
[im 5/30]
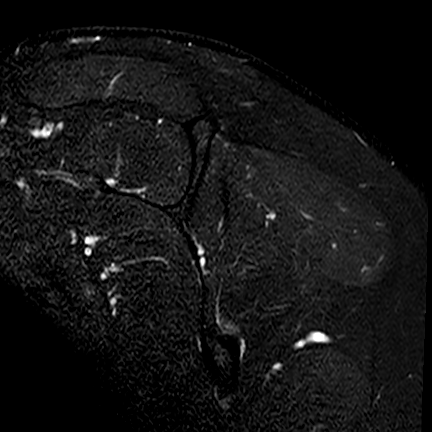
[im 9/30]
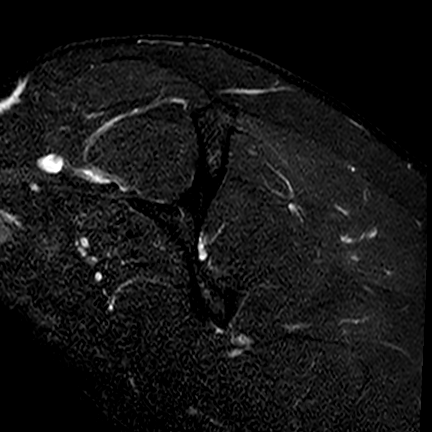
[im 13/30]
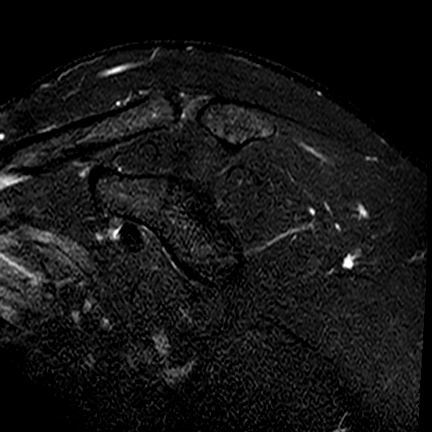
[im 17/30]
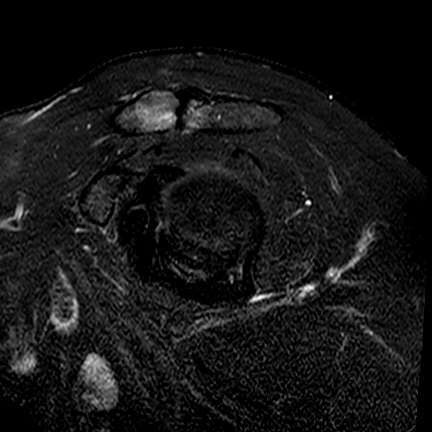
[im 21/30]
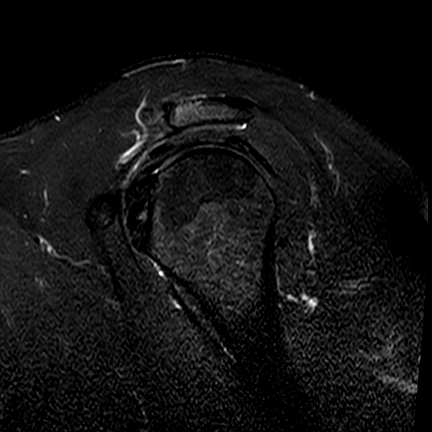
[im 25/30]
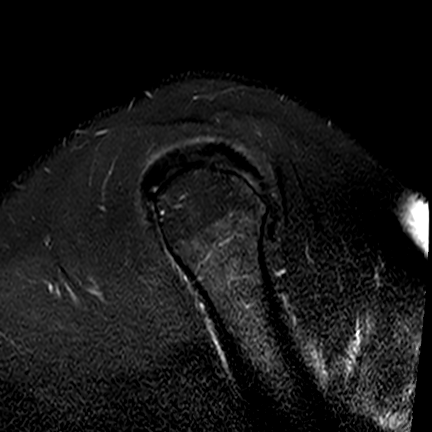
[im 30/30]
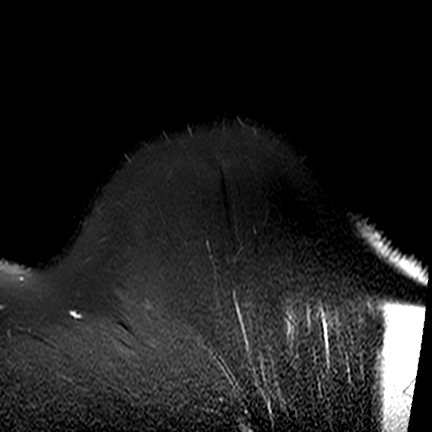

[Series 501: t1_sag* · coronal · 3.0mm · 0.29mm/px · 3 of 30 slices shown]
[im 1/30]
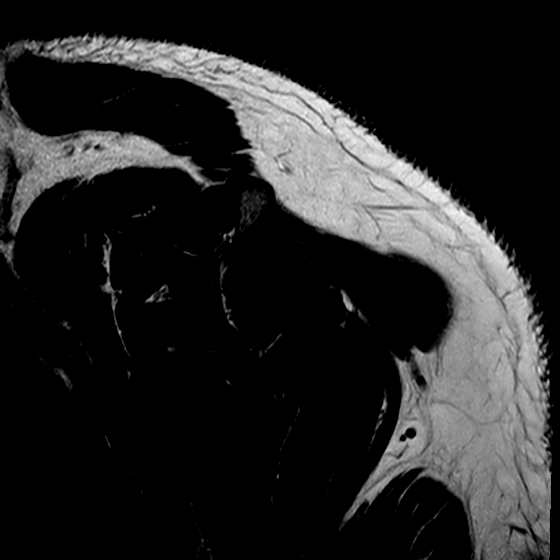
[im 5/30]
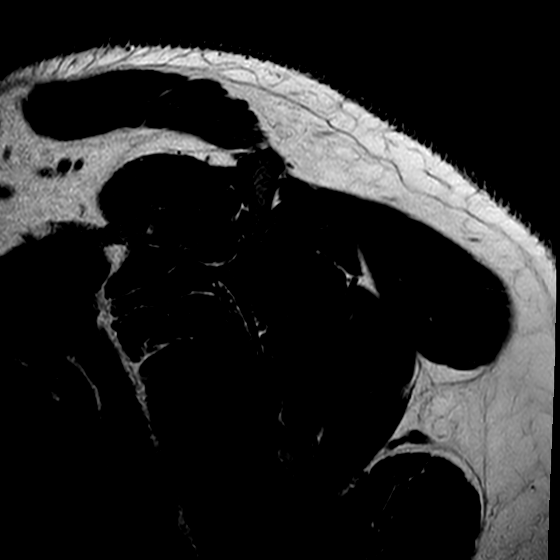
[im 9/30]
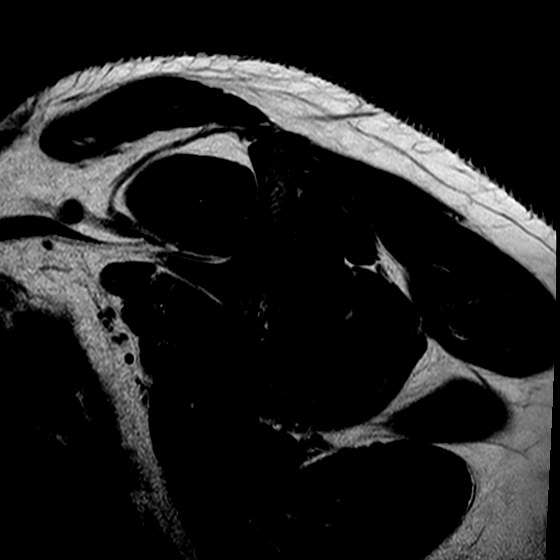

[23 of 40 positions shown; findings below may reference images not displayed]

FINDINGS: ROTATOR CUFF: 1 and 3 mm low-grade partial-thickness articular sided distal 
supraspinatus tendon tears. 1 mm low-grade partial-thickness distal 
infraspinatus tendon tear. 3 mm distal subscapularis partial thickness tendon 
tear. The teres minor tendon is intact. The rotator cuff musculature is 
symmetric without mass, signal abnormality or atrophy. 
ACROMIOCLAVICULAR JOINT: Mild degenerative change of the acromioclavicular joint 
and marrow edema-like signal changes of the distal acromion/clavicle; no mass 
effect upon the supraspinatus. The coracoacromial ligament is intact without 
prominent spurring at the acromial attachment. The acromioclavicular and 
coracoclavicular ligaments are preserved. The acromium is normal in morphology. 
GLENOHUMERAL JOINT: The humeral head is well located within the glenoid fossa. 
Articular cartilage is preserved.  Degenerative posterior and inferior labral 
tears with small inferior paralabral cysts measuring up to 0.5 cm. The 
intra-articular portion of the long head of the biceps tendon is negative. No 
shoulder joint effusion. 
BONES: The bone marrow signal intensity is negative for fracture. No Hill-Sachs 
defect. Subcortical cystic change of the humeral head. 
ADDITIONAL FINDINGS: The axillary region is negative. Subcutaneous tissues are 
negative.
IMPRESSION: 1.  Multiple 1-3 mm low-grade partial-thickness tendon tears at the distal 
supraspinatus, infraspinatus and subscapularis tendons. 
2.  Degenerative posterior and inferior labral tears with small inferior 
paralabral cysts measuring up to 0.5 cm.  
3.  Mild AC joint degenerative change and marrow edema without mass effect upon 
the supraspinatus.

## 2021-05-28 MED ORDER — OXYCODONE ER 20 MG TABLET,CRUSH RESISTANT,EXTENDED RELEASE 12 HR
ORAL_TABLET | Freq: Two times a day (BID) | ORAL | 0 refills | 30.00000 days | Status: CP
Start: 2021-05-28 — End: 2021-06-27

## 2021-05-28 MED ORDER — OXYCODONE-ACETAMINOPHEN 10 MG-325 MG TABLET
ORAL_TABLET | Freq: Three times a day (TID) | ORAL | 0 refills | 30 days | Status: CP | PRN
Start: 2021-05-28 — End: 2021-06-27

## 2021-05-29 IMAGING — MR MRI RIGHT SHOULDER WITHOUT CONTRAST
4 of 6 series · 23 of 40 positions shown · non-contrast
Comparison: None

________________________________________________________________________________________________ 
MRI RIGHT SHOULDER WITHOUT CONTRA, 05/29/2021 [DATE]: 
CLINICAL INDICATION: Pain in right shoulder. Prior rotator cuff repair in 5212.
TECHNIQUE: Multiplanar, multiecho position MR images of the shoulder were 
performed without contrast. Images were reviewed and arthrogram was felt to be 
non-additive. Patient was scanned on a 1.5T magnet.

[Series 201: survey mst · axial · 10.0mm · 0.99mm/px · z∈[-40,+175]mm · 4 of 15 slices shown]
[im 1/15]
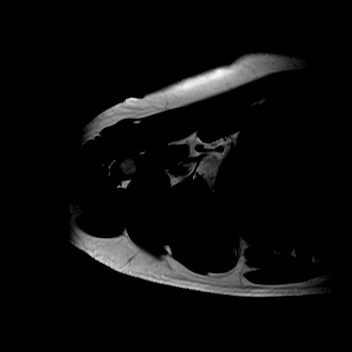
[im 5/15]
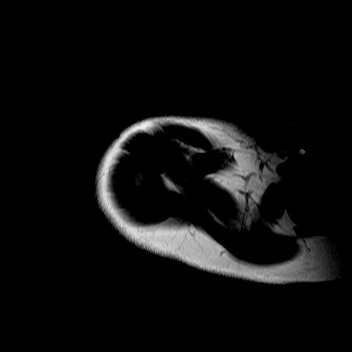
[im 10/15]
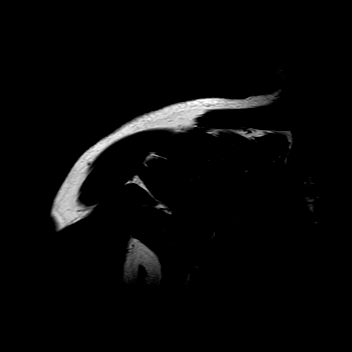
[im 15/15]
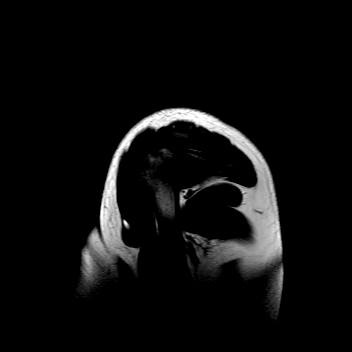

[Series 301: (person_name)_(person_name)_(person_name)* · axial · 3.0mm · 0.35mm/px · z∈[-30,+64]mm · 8 of 30 slices shown]
[im 1/30]
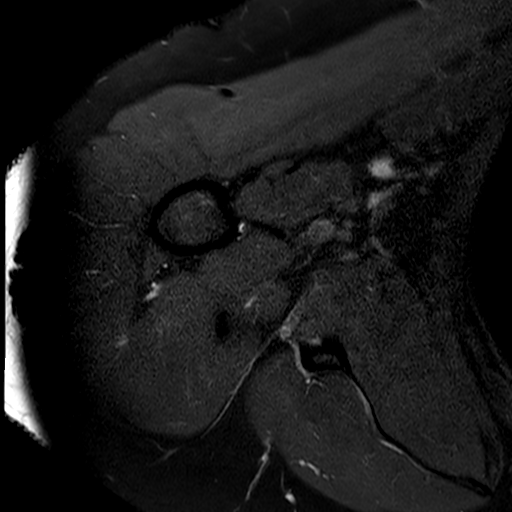
[im 5/30]
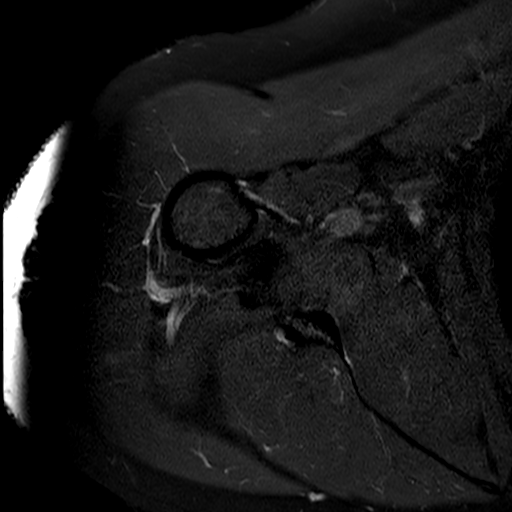
[im 9/30]
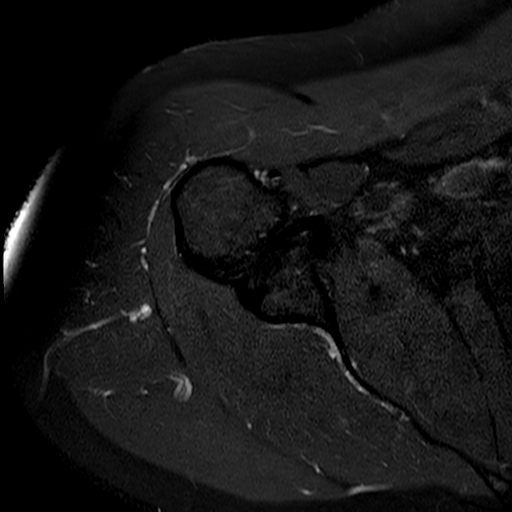
[im 13/30]
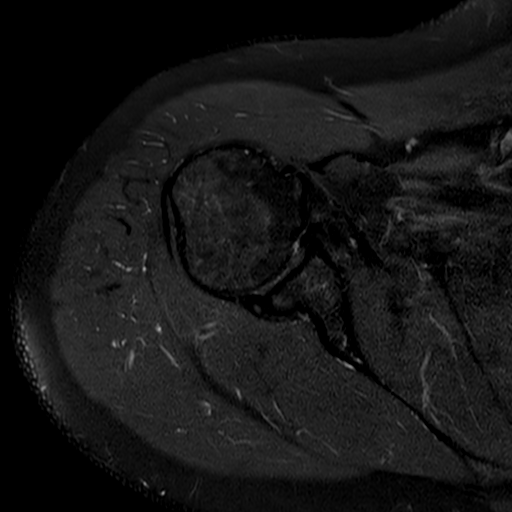
[im 17/30]
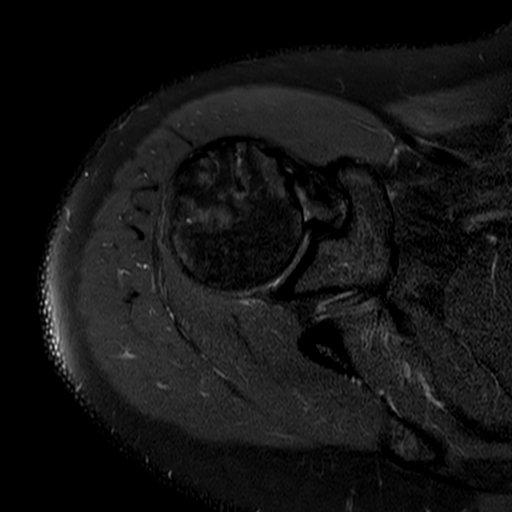
[im 21/30]
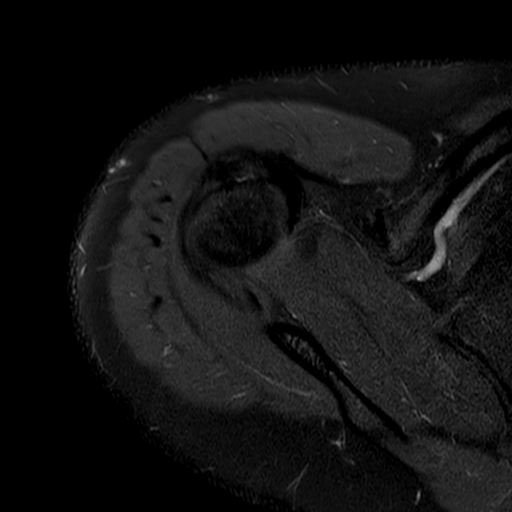
[im 25/30]
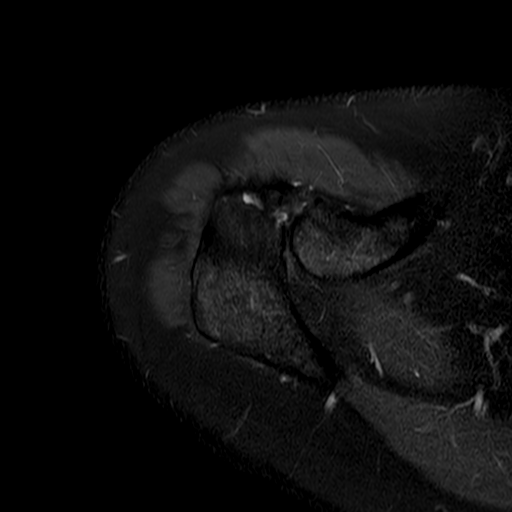
[im 30/30]
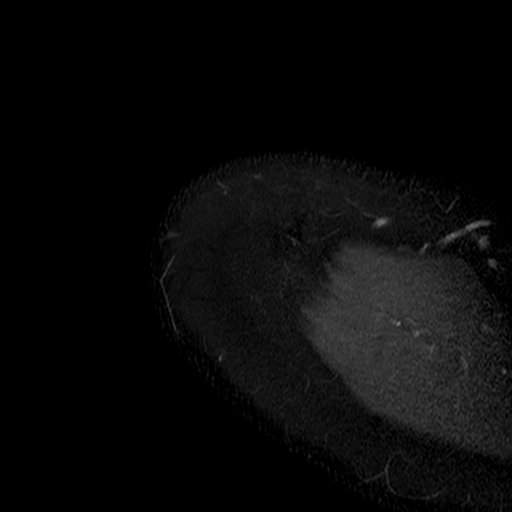

[Series 401: t2_fs_sag* · oblique · 3.0mm · 0.37mm/px · 8 of 30 slices shown]
[im 1/30]
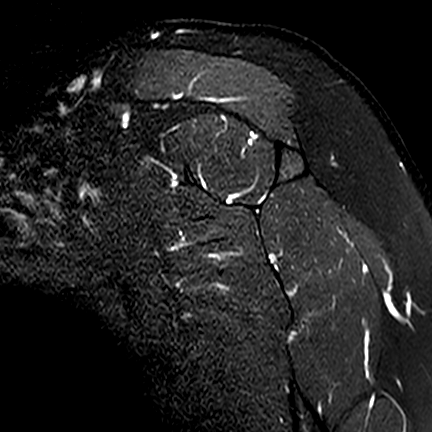
[im 5/30]
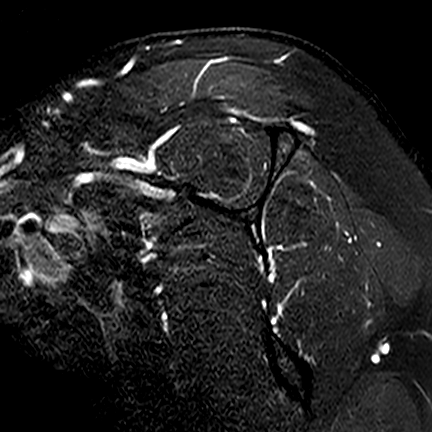
[im 9/30]
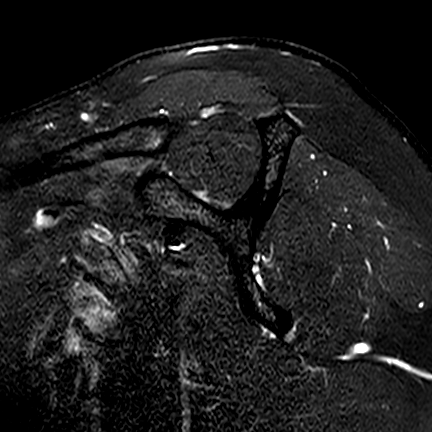
[im 13/30]
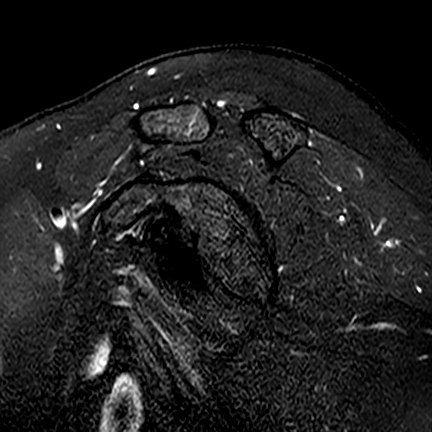
[im 17/30]
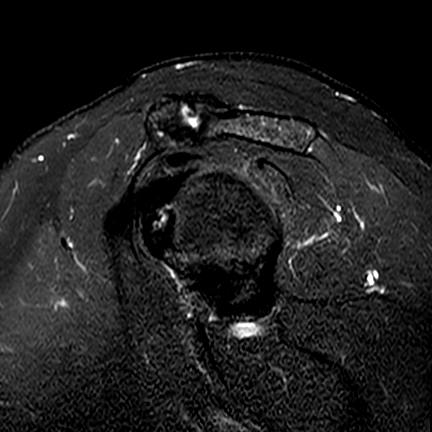
[im 21/30]
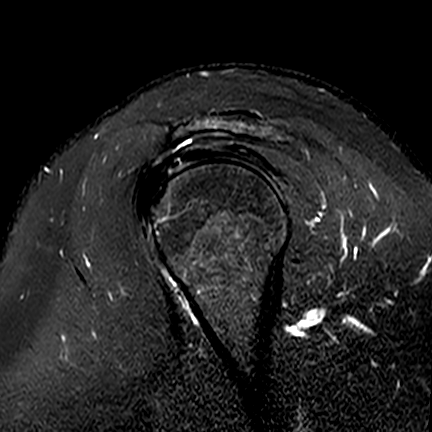
[im 25/30]
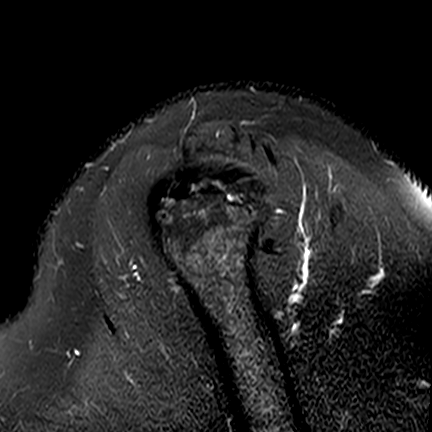
[im 30/30]
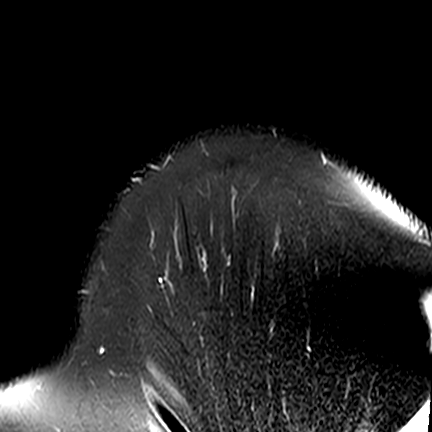

[Series 501: pd_fs_cor* · oblique · 3.0mm · 0.34mm/px · 3 of 24 slices shown]
[im 5/24]
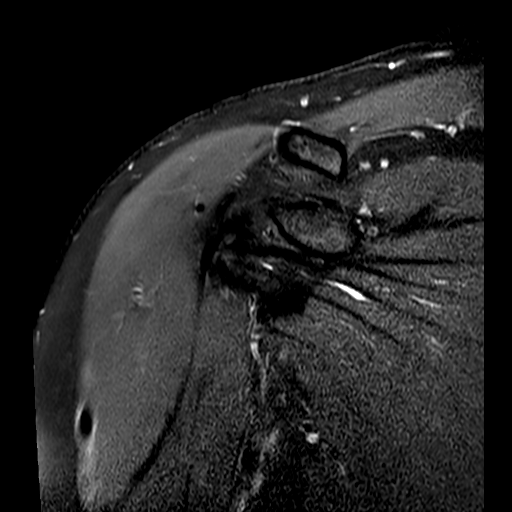
[im 14/24]
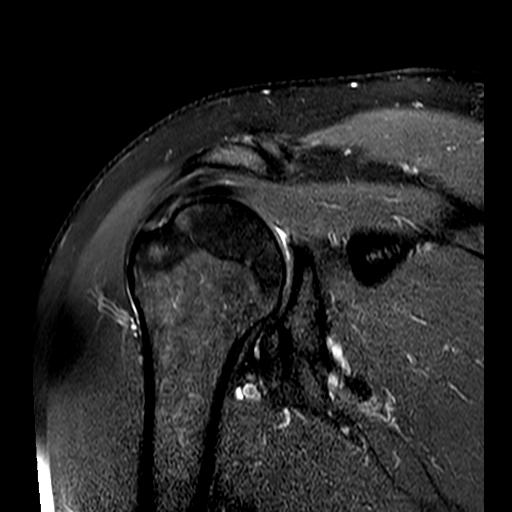
[im 24/24]
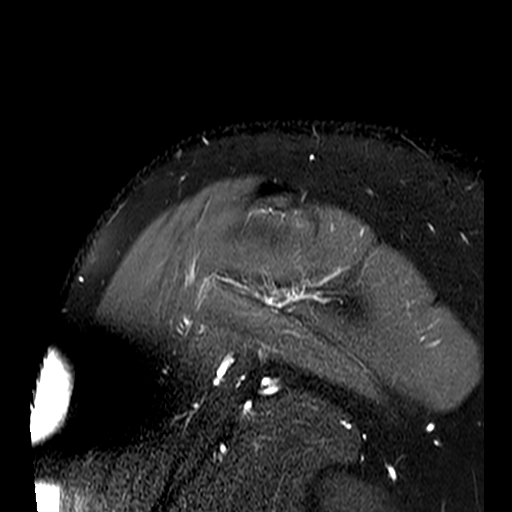

[23 of 40 positions shown; findings below may reference images not displayed]

FINDINGS: ROTATOR CUFF: Status post rotator cuff repair. Small low-grade partial-thickness 
articular sided distal supraspinatus and infraspinatus tendon tears measure up 
to 4 mm. Small low-grade partial-thickness distal subscapularis tendon tears (up 
to 3 mm) and tendinosis. The teres minor tendon is preserved. The rotator cuff 
musculature is symmetric without mass, signal abnormality or atrophy. 
ACROMIOCLAVICULAR JOINT: The acromioclavicular joint is preserved. The 
coracoacromial ligament is intact without prominent spurring at the acromial 
attachment. The acromioclavicular and coracoclavicular ligaments are preserved. 
The acromium is normal in morphology. 
GLENOHUMERAL JOINT: The humeral head is well located within the glenoid fossa. 
The glenoid labrum is preserved. No paralabral cyst. The intra-articular portion 
of the long head of the biceps tendon is negative. No shoulder joint effusion. 
BONES AND SOFT TISSUES: Bioabsorbable suture anchor in anterior humeral head. 
The bone marrow signal intensity is negative for fracture. No Hill-Sachs defect. 
The axillary region is negative. Subcutaneous tissues are negative.
IMPRESSION: 1.  Status post rotator cuff repair. 
2.  Small low-grade partial-thickness tears of the distal supraspinatus, 
infraspinatus and subscapularis tendons. 
3.  Mild AC joint degenerative change without mass effect upon the 
supraspinatus.

## 2021-05-29 MED ORDER — ATORVASTATIN 40 MG TABLET
ORAL_TABLET | Freq: Every day | ORAL | 1 refills | 90 days | Status: CP
Start: 2021-05-29 — End: ?

## 2021-06-25 DIAGNOSIS — M792 Neuralgia and neuritis, unspecified: Principal | ICD-10-CM

## 2021-06-25 DIAGNOSIS — M5416 Radiculopathy, lumbar region: Principal | ICD-10-CM

## 2021-06-25 DIAGNOSIS — G894 Chronic pain syndrome: Principal | ICD-10-CM

## 2021-06-25 DIAGNOSIS — M47816 Spondylosis without myelopathy or radiculopathy, lumbar region: Principal | ICD-10-CM

## 2021-06-27 MED ORDER — OXYCODONE ER 20 MG TABLET,CRUSH RESISTANT,EXTENDED RELEASE 12 HR
ORAL_TABLET | Freq: Two times a day (BID) | ORAL | 0 refills | 30 days | Status: CP
Start: 2021-06-27 — End: 2021-07-27

## 2021-06-27 MED ORDER — OXYCODONE-ACETAMINOPHEN 10 MG-325 MG TABLET
ORAL_TABLET | Freq: Three times a day (TID) | ORAL | 0 refills | 30 days | Status: CP | PRN
Start: 2021-06-27 — End: 2021-07-27

## 2021-07-07 ENCOUNTER — Ambulatory Visit: Admit: 2021-07-07 | Discharge: 2021-07-08 | Payer: PRIVATE HEALTH INSURANCE

## 2021-07-07 DIAGNOSIS — Z981 Arthrodesis status: Principal | ICD-10-CM

## 2021-07-07 DIAGNOSIS — G629 Polyneuropathy, unspecified: Principal | ICD-10-CM

## 2021-07-07 DIAGNOSIS — M792 Neuralgia and neuritis, unspecified: Principal | ICD-10-CM

## 2021-07-07 DIAGNOSIS — G8929 Other chronic pain: Principal | ICD-10-CM

## 2021-07-07 DIAGNOSIS — M5416 Radiculopathy, lumbar region: Principal | ICD-10-CM

## 2021-07-07 MED ORDER — PREGABALIN 100 MG CAPSULE
ORAL_CAPSULE | Freq: Three times a day (TID) | ORAL | 1 refills | 30.00000 days | Status: CP
Start: 2021-07-07 — End: 2021-09-05

## 2021-07-17 ENCOUNTER — Ambulatory Visit: Admit: 2021-07-17 | Discharge: 2021-07-18 | Payer: PRIVATE HEALTH INSURANCE

## 2021-07-17 DIAGNOSIS — G8929 Other chronic pain: Principal | ICD-10-CM

## 2021-07-17 DIAGNOSIS — M5416 Radiculopathy, lumbar region: Principal | ICD-10-CM

## 2021-07-24 MED ORDER — OXYCODONE-ACETAMINOPHEN 10 MG-325 MG TABLET
ORAL_TABLET | 0 refills | 0 days | Status: CP
Start: 2021-07-24 — End: 2021-08-25

## 2021-07-26 MED ORDER — OXYCODONE-ACETAMINOPHEN 10 MG-325 MG TABLET
ORAL_TABLET | Freq: Three times a day (TID) | ORAL | 0 refills | 30 days | Status: CP | PRN
Start: 2021-07-26 — End: 2021-08-26

## 2021-07-26 MED ORDER — OXYCODONE ER 20 MG TABLET,CRUSH RESISTANT,EXTENDED RELEASE 12 HR
ORAL_TABLET | Freq: Two times a day (BID) | ORAL | 0 refills | 30.00000 days | Status: CP
Start: 2021-07-26 — End: 2021-08-26

## 2021-08-04 DIAGNOSIS — M5416 Radiculopathy, lumbar region: Principal | ICD-10-CM

## 2021-08-04 DIAGNOSIS — G8929 Other chronic pain: Principal | ICD-10-CM

## 2021-08-04 DIAGNOSIS — M792 Neuralgia and neuritis, unspecified: Principal | ICD-10-CM

## 2021-08-04 MED ORDER — OXYCODONE ER 20 MG TABLET,CRUSH RESISTANT,EXTENDED RELEASE 12 HR
ORAL_TABLET | Freq: Two times a day (BID) | ORAL | 0 refills | 30.00000 days | Status: CP
Start: 2021-08-04 — End: 2021-09-04

## 2021-08-20 MED ORDER — PREGABALIN 100 MG CAPSULE
ORAL_CAPSULE | Freq: Three times a day (TID) | ORAL | 1 refills | 30 days | Status: CP
Start: 2021-08-20 — End: 2021-10-19

## 2021-08-26 MED ORDER — OXYCODONE ER 20 MG TABLET,CRUSH RESISTANT,EXTENDED RELEASE 12 HR
ORAL_TABLET | Freq: Two times a day (BID) | ORAL | 0 refills | 30 days | Status: CP
Start: 2021-08-26 — End: 2021-09-25

## 2021-08-26 MED ORDER — OXYCODONE-ACETAMINOPHEN 10 MG-325 MG TABLET
ORAL_TABLET | Freq: Three times a day (TID) | ORAL | 0 refills | 30 days | Status: CP | PRN
Start: 2021-08-26 — End: 2021-09-25

## 2021-09-01 ENCOUNTER — Ambulatory Visit: Admit: 2021-09-01 | Discharge: 2021-09-02 | Payer: PRIVATE HEALTH INSURANCE

## 2021-09-01 DIAGNOSIS — M4726 Other spondylosis with radiculopathy, lumbar region: Principal | ICD-10-CM

## 2021-09-01 DIAGNOSIS — M5416 Radiculopathy, lumbar region: Principal | ICD-10-CM

## 2021-09-01 DIAGNOSIS — G894 Chronic pain syndrome: Principal | ICD-10-CM

## 2021-09-01 DIAGNOSIS — G8929 Other chronic pain: Principal | ICD-10-CM

## 2021-09-01 DIAGNOSIS — M792 Neuralgia and neuritis, unspecified: Principal | ICD-10-CM

## 2021-09-01 MED ORDER — PREGABALIN 100 MG CAPSULE
ORAL_CAPSULE | Freq: Three times a day (TID) | ORAL | 5 refills | 30 days | Status: CP
Start: 2021-09-01 — End: 2022-02-28

## 2021-09-01 MED ORDER — OXYCODONE-ACETAMINOPHEN 10 MG-325 MG TABLET
ORAL_TABLET | Freq: Four times a day (QID) | ORAL | 0 refills | 24 days | Status: CP | PRN
Start: 2021-09-01 — End: 2021-10-01

## 2021-09-03 MED ORDER — OXYCODONE ER 20 MG TABLET,CRUSH RESISTANT,EXTENDED RELEASE 12 HR
ORAL_TABLET | Freq: Two times a day (BID) | ORAL | 0 refills | 30 days | Status: CP
Start: 2021-09-03 — End: 2021-10-03

## 2021-09-16 ENCOUNTER — Ambulatory Visit: Admit: 2021-09-16 | Discharge: 2021-09-16 | Payer: PRIVATE HEALTH INSURANCE

## 2021-09-16 ENCOUNTER — Ambulatory Visit
Admit: 2021-09-16 | Discharge: 2021-09-16 | Payer: PRIVATE HEALTH INSURANCE | Attending: Pain Medicine | Primary: Pain Medicine

## 2021-09-16 DIAGNOSIS — M5416 Radiculopathy, lumbar region: Principal | ICD-10-CM

## 2021-09-24 ENCOUNTER — Ambulatory Visit: Admit: 2021-09-24 | Discharge: 2021-09-25 | Payer: PRIVATE HEALTH INSURANCE

## 2021-09-24 DIAGNOSIS — G8929 Other chronic pain: Principal | ICD-10-CM

## 2021-09-24 DIAGNOSIS — M792 Neuralgia and neuritis, unspecified: Principal | ICD-10-CM

## 2021-09-24 DIAGNOSIS — M5416 Radiculopathy, lumbar region: Principal | ICD-10-CM

## 2021-09-24 DIAGNOSIS — Z981 Arthrodesis status: Principal | ICD-10-CM

## 2021-10-01 MED ORDER — OXYCODONE-ACETAMINOPHEN 10 MG-325 MG TABLET
ORAL_TABLET | Freq: Three times a day (TID) | ORAL | 0 refills | 30 days | Status: CP | PRN
Start: 2021-10-01 — End: 2021-10-31

## 2021-10-03 MED ORDER — MORPHINE ER 30 MG TABLET,EXTENDED RELEASE
ORAL_TABLET | Freq: Two times a day (BID) | ORAL | 0 refills | 30 days | Status: CP
Start: 2021-10-03 — End: 2021-11-02

## 2021-10-22 ENCOUNTER — Ambulatory Visit: Admit: 2021-10-22 | Discharge: 2021-10-22 | Payer: PRIVATE HEALTH INSURANCE

## 2021-10-22 DIAGNOSIS — M5416 Radiculopathy, lumbar region: Principal | ICD-10-CM

## 2021-10-22 DIAGNOSIS — M4726 Other spondylosis with radiculopathy, lumbar region: Principal | ICD-10-CM

## 2021-10-22 DIAGNOSIS — G894 Chronic pain syndrome: Principal | ICD-10-CM

## 2021-10-22 DIAGNOSIS — M792 Neuralgia and neuritis, unspecified: Principal | ICD-10-CM

## 2021-10-22 MED ORDER — KETOROLAC 10 MG TABLET
ORAL_TABLET | Freq: Four times a day (QID) | ORAL | 0 refills | 5.00000 days | Status: CP | PRN
Start: 2021-10-22 — End: 2021-10-27

## 2021-10-31 MED ORDER — OXYCODONE-ACETAMINOPHEN 10 MG-325 MG TABLET
ORAL_TABLET | Freq: Three times a day (TID) | ORAL | 0 refills | 35.00000 days | Status: CP | PRN
Start: 2021-10-31 — End: 2021-12-05

## 2021-11-02 MED ORDER — MORPHINE ER 30 MG TABLET,EXTENDED RELEASE
ORAL_TABLET | Freq: Two times a day (BID) | ORAL | 0 refills | 30 days | Status: CP
Start: 2021-11-02 — End: 2021-12-02

## 2021-11-05 MED ORDER — MORPHINE ER 30 MG TABLET,EXTENDED RELEASE
ORAL_TABLET | Freq: Two times a day (BID) | ORAL | 0 refills | 30 days | Status: CP
Start: 2021-11-05 — End: 2021-12-05

## 2021-12-05 MED ORDER — MORPHINE ER 30 MG TABLET,EXTENDED RELEASE
ORAL_TABLET | Freq: Two times a day (BID) | ORAL | 0 refills | 30 days | Status: CP
Start: 2021-12-05 — End: 2022-01-04

## 2021-12-05 MED ORDER — OXYCODONE-ACETAMINOPHEN 10 MG-325 MG TABLET
ORAL_TABLET | Freq: Three times a day (TID) | ORAL | 0 refills | 30 days | Status: CP | PRN
Start: 2021-12-05 — End: 2022-01-04

## 2021-12-09 IMAGING — MR MRI LEFT KNEE WITHOUT CONTRAST
4 of 6 series · 19 of 40 positions shown · IV contrast (gadolinium)
Comparison: None prior.

________________________________________________________________________________________________ 
MRI LEFT KNEE WITHOUT CONTRAST, 12/09/2021 [DATE]: 
CLINICAL INDICATION: Pain. Effusion. Unilateral primary osteoarthritis. History 
of 2 prior knee surgeries with medial meniscal repair in 2667. Injury occurred 
while trimming tree in October 2020.
TECHNIQUE: Multiplanar, multiecho position MR images of the knee were performed 
without intravenous gadolinium enhancement. Patient was scanned on a
magnet.

[Series 101: survey_fullfov_transversal · axial · 10.0mm · 1.84mm/px · 1 of 7 slices shown]
[im 1/7]
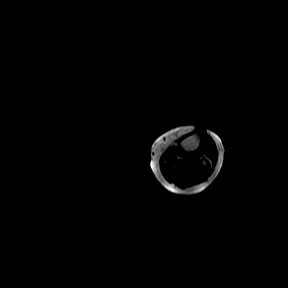

[Series 201: survey_ · axial · 10.0mm · 1.17mm/px · z∈[+30,+149]mm · 2 of 9 slices shown]
[im 1/9]
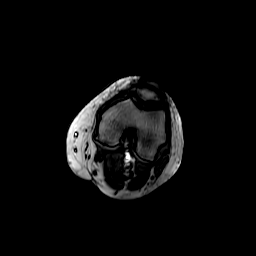
[im 9/9]
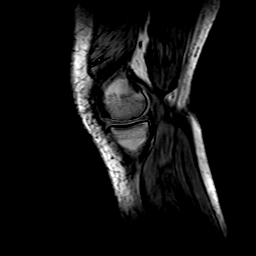

[Series 301: pd_fs_tra · axial · 3.5mm · 0.35mm/px · z∈[-44,+100]mm · 8 of 36 slices shown]
[im 1/36]
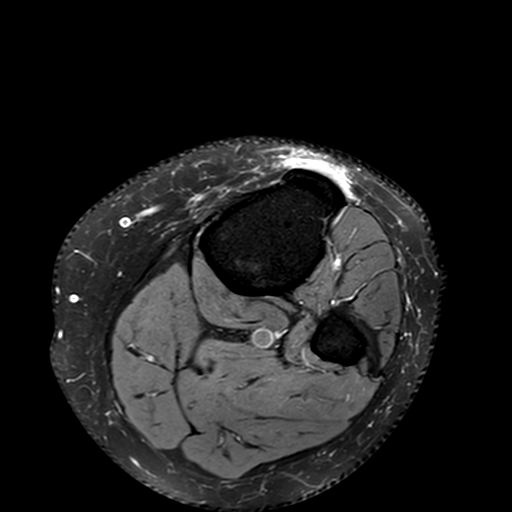
[im 4/36]
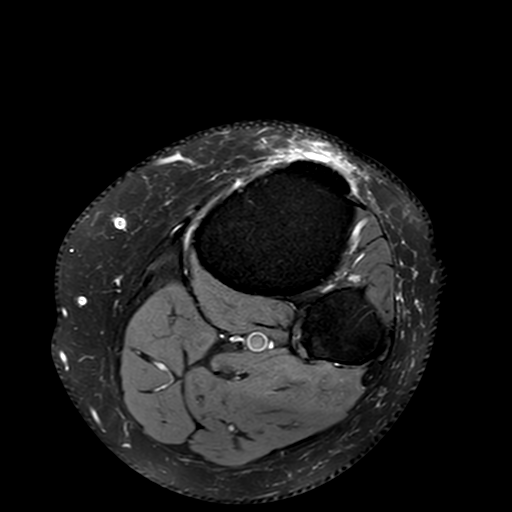
[im 12/36]
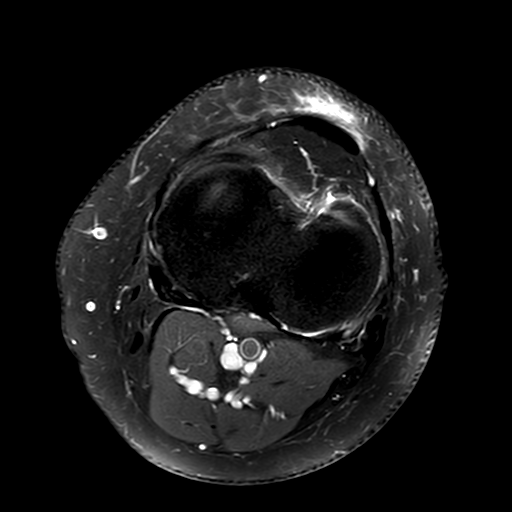
[im 16/36]
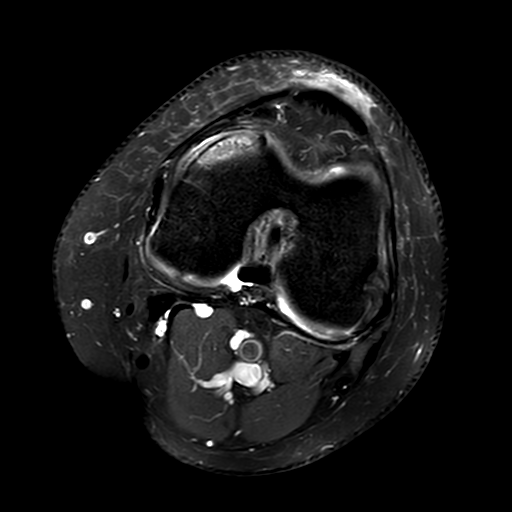
[im 20/36]
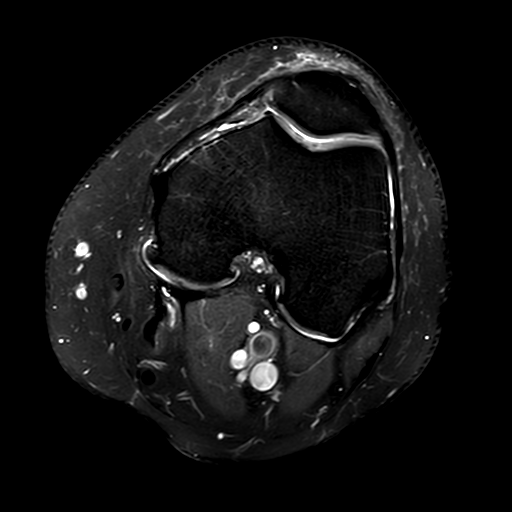
[im 24/36]
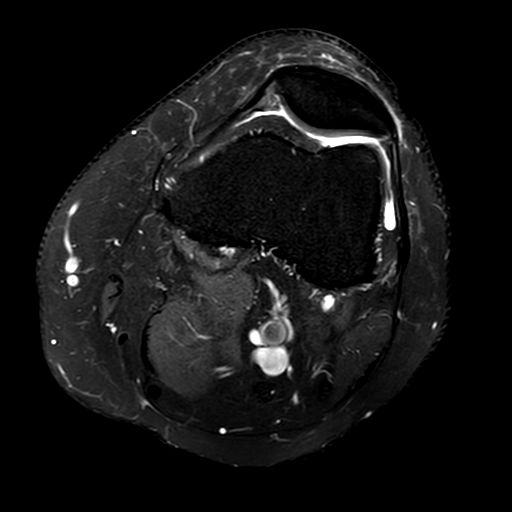
[im 32/36]
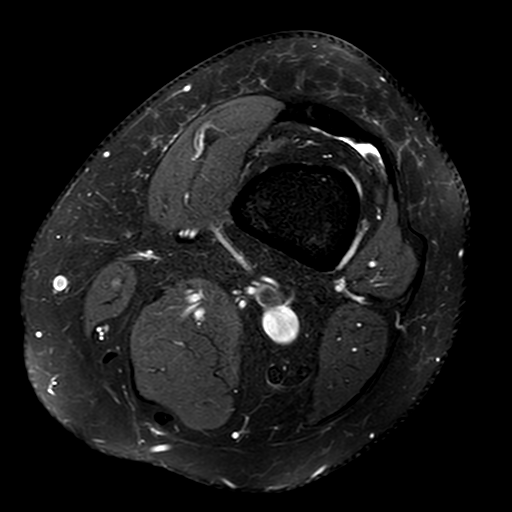
[im 36/36]
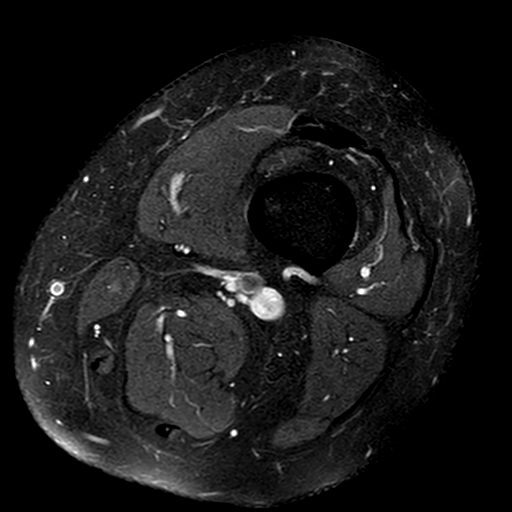

[Series 401: pd_fs_sag fh · sagittal · 3.0mm · 0.30mm/px · 8 of 34 slices shown]
[im 1/34]
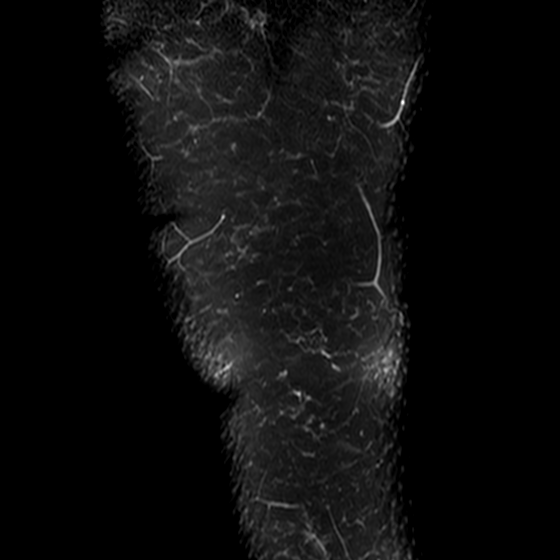
[im 5/34]
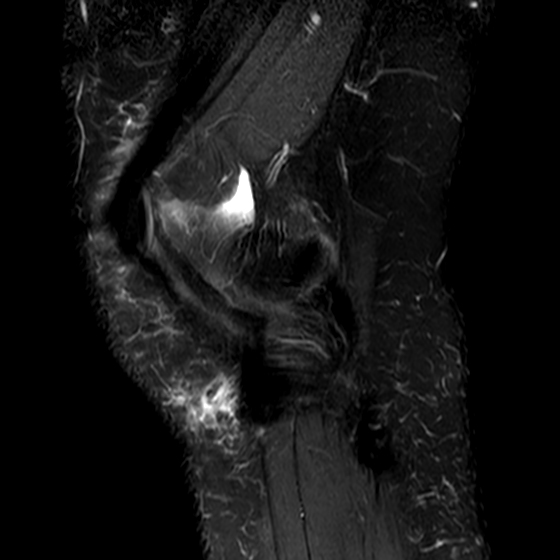
[im 9/34]
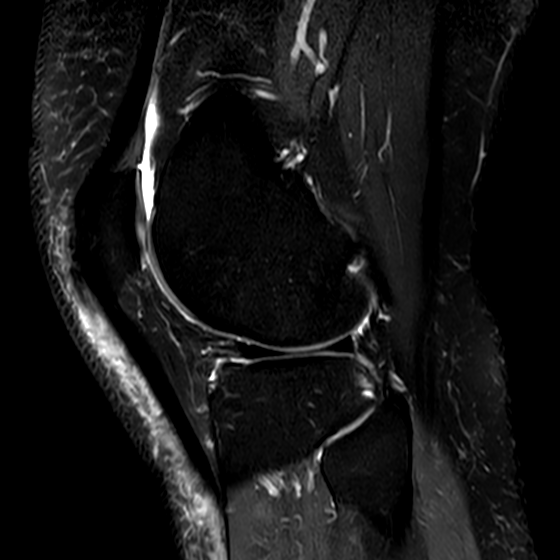
[im 13/34]
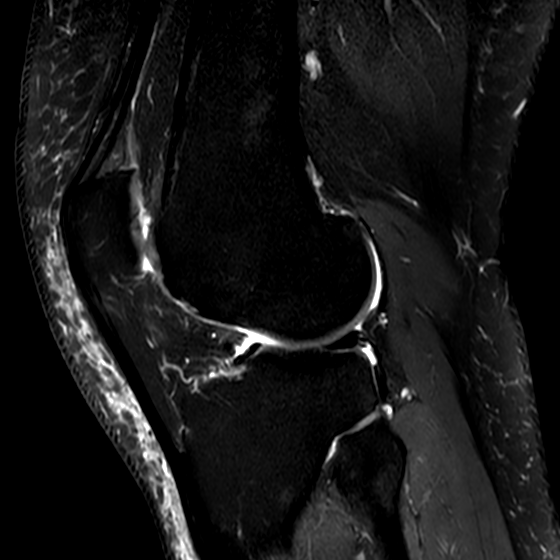
[im 17/34]
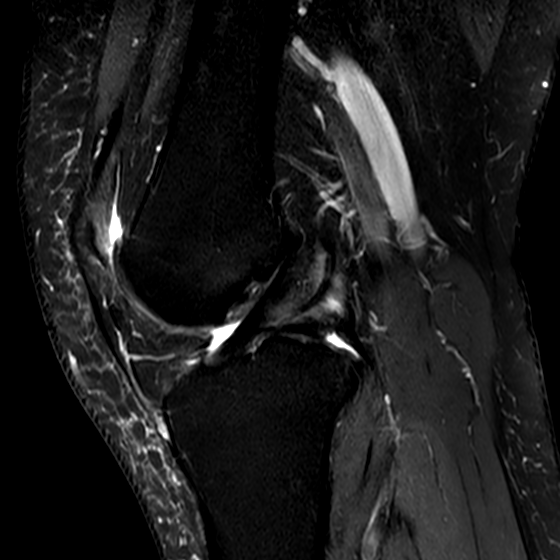
[im 21/34]
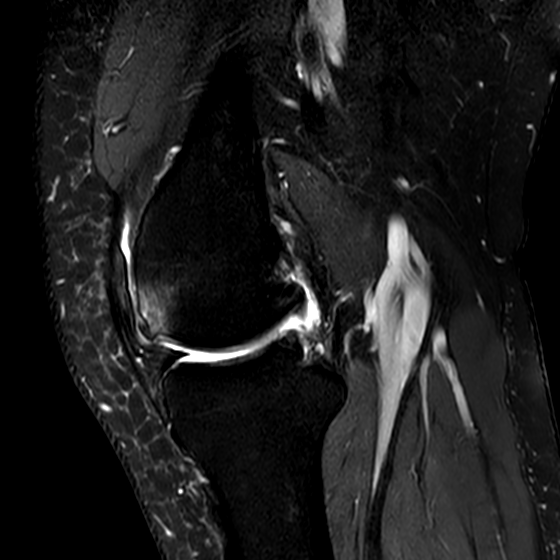
[im 25/34]
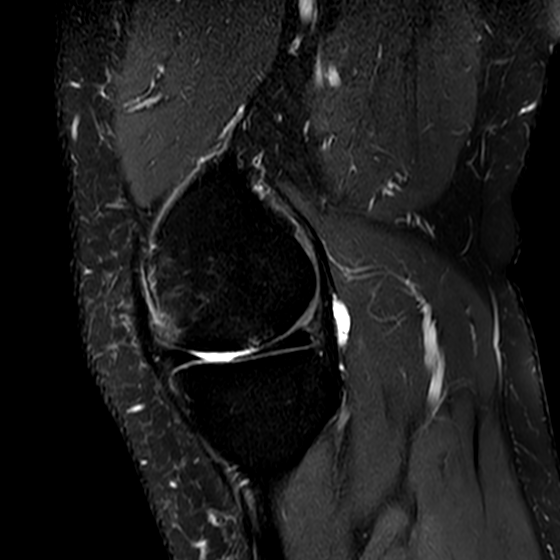
[im 29/34]
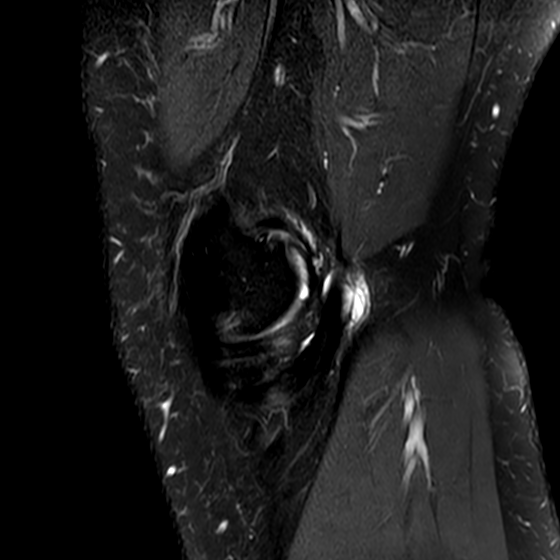

[19 of 40 positions shown; findings below may reference images not displayed]

FINDINGS: MEDIAL COMPARTMENT: The medial meniscus is intact without tear or extrusion. 
There is advanced articular cartilaginous loss with areas of full-thickness, 
grade 4 chondral loss. 
LATERAL COMPARTMENT: The lateral meniscus is intact without tear or extrusion. 
Mild chondral loss, grade 2. 
PATELLOFEMORAL COMPARTMENT: The patella is centrally located. Mild to moderate 
chondral loss, grade 2 to grade 3 with prominent chondral fissuring of the 
patellar median eminence. 
TIBIOFIBULAR COMPARTMENT: Negative. 
LIGAMENTS: The anterior cruciate ligament is intact. The posterior cruciate 
ligament is intact. The medial collateral ligament and lateral collateral 
ligaments are preserved. 
EXTENSOR MECHANISM: The quadriceps and patellar tendon are preserved. The medial 
and lateral retinacula are intact. 
POSTEROMEDIAL CORNER: The semimembranosus and pes anserine tendons are 
preserved. The posterior oblique ligament and posterior medial joint capsule are 
intact. 
POSTEROLATERAL CORNER: The popliteal tendon and popliteofibular ligament are 
intact. The biceps femoris is negative. 
ADDITIONAL FINDINGS: There is cortical irregularity which is well-corticated 
involving the anterior aspect of the medial femoral condyle suggestive for old 
prior fracture deformity. This is seen for example on series 401, image 21. 
There is mild marrow edema at this level. Trace knee joint effusion. Trace fluid 
within the semimembranosus/medial head of the gastrocnemius bursa. The 
musculature is normal without mass, signal abnormality or atrophy. Neurovascular 
bundles are negative. Mild edema within the subcutaneous tissues greatest 
anteriorly without bursitis.
IMPRESSION: 1.  Tricompartmental degenerative changes of the knee with advanced involvement 
of the medial compartment. 
2.  Well-corticated cortical irregularity of the anterior aspect of the medial 
femoral condyle suggestive for old fracture deformity with subjacent reactive 
marrow edema.

## 2021-12-15 ENCOUNTER — Ambulatory Visit: Admit: 2021-12-15 | Discharge: 2021-12-16 | Payer: PRIVATE HEALTH INSURANCE

## 2021-12-15 DIAGNOSIS — M792 Neuralgia and neuritis, unspecified: Principal | ICD-10-CM

## 2021-12-15 DIAGNOSIS — M5416 Radiculopathy, lumbar region: Principal | ICD-10-CM

## 2021-12-15 DIAGNOSIS — M4726 Other spondylosis with radiculopathy, lumbar region: Principal | ICD-10-CM

## 2021-12-15 DIAGNOSIS — G894 Chronic pain syndrome: Principal | ICD-10-CM

## 2021-12-15 MED ORDER — KETOROLAC 10 MG TABLET
ORAL_TABLET | Freq: Four times a day (QID) | ORAL | 0 refills | 5 days | Status: CP | PRN
Start: 2021-12-15 — End: 2021-12-20

## 2021-12-15 MED ORDER — CELECOXIB 200 MG CAPSULE
ORAL_CAPSULE | 1 refills | 0 days | Status: CP
Start: 2021-12-15 — End: 2022-02-14

## 2021-12-15 MED ORDER — CYCLOBENZAPRINE 10 MG TABLET
ORAL_TABLET | Freq: Two times a day (BID) | ORAL | 5 refills | 30 days | Status: CP | PRN
Start: 2021-12-15 — End: 2022-06-13

## 2021-12-24 MED ORDER — MORPHINE ER 30 MG TABLET,EXTENDED RELEASE
ORAL_TABLET | Freq: Two times a day (BID) | ORAL | 0 refills | 30 days | Status: CP
Start: 2021-12-24 — End: 2022-01-23

## 2022-01-01 MED ORDER — OXYCODONE-ACETAMINOPHEN 10 MG-325 MG TABLET
ORAL_TABLET | Freq: Three times a day (TID) | ORAL | 0 refills | 30 days | Status: CP | PRN
Start: 2022-01-01 — End: 2022-01-31

## 2022-01-23 MED ORDER — MORPHINE ER 30 MG TABLET,EXTENDED RELEASE
ORAL_TABLET | Freq: Two times a day (BID) | ORAL | 0 refills | 30 days | Status: CP
Start: 2022-01-23 — End: 2022-02-22

## 2022-01-31 MED ORDER — OXYCODONE-ACETAMINOPHEN 10 MG-325 MG TABLET
ORAL_TABLET | Freq: Three times a day (TID) | ORAL | 0 refills | 30 days | Status: CP | PRN
Start: 2022-01-31 — End: 2022-03-02

## 2022-02-09 ENCOUNTER — Ambulatory Visit: Admit: 2022-02-09 | Discharge: 2022-02-10 | Payer: PRIVATE HEALTH INSURANCE

## 2022-02-09 DIAGNOSIS — M792 Neuralgia and neuritis, unspecified: Principal | ICD-10-CM

## 2022-02-09 DIAGNOSIS — M4726 Other spondylosis with radiculopathy, lumbar region: Principal | ICD-10-CM

## 2022-02-09 DIAGNOSIS — G894 Chronic pain syndrome: Principal | ICD-10-CM

## 2022-02-09 DIAGNOSIS — M5416 Radiculopathy, lumbar region: Principal | ICD-10-CM

## 2022-02-09 DIAGNOSIS — G8929 Other chronic pain: Principal | ICD-10-CM

## 2022-02-09 MED ORDER — PREGABALIN 100 MG CAPSULE
ORAL_CAPSULE | Freq: Three times a day (TID) | ORAL | 5 refills | 30 days | Status: CP
Start: 2022-02-09 — End: 2022-08-08

## 2022-02-21 MED ORDER — MORPHINE ER 30 MG TABLET,EXTENDED RELEASE
ORAL_TABLET | Freq: Two times a day (BID) | ORAL | 0 refills | 30 days | Status: CP
Start: 2022-02-21 — End: 2022-03-23

## 2022-03-02 MED ORDER — OXYCODONE-ACETAMINOPHEN 10 MG-325 MG TABLET
ORAL_TABLET | Freq: Three times a day (TID) | ORAL | 0 refills | 22 days | Status: CP | PRN
Start: 2022-03-02 — End: 2022-03-24

## 2022-03-23 MED ORDER — KETOROLAC 10 MG TABLET
ORAL_TABLET | Freq: Four times a day (QID) | ORAL | 0 refills | 5 days | Status: CP | PRN
Start: 2022-03-23 — End: 2022-03-28

## 2022-03-23 MED ORDER — OXYCODONE-ACETAMINOPHEN 10 MG-325 MG TABLET
ORAL_TABLET | Freq: Three times a day (TID) | ORAL | 0 refills | 30 days | Status: CP | PRN
Start: 2022-03-23 — End: 2022-04-23

## 2022-03-23 MED ORDER — MORPHINE ER 30 MG TABLET,EXTENDED RELEASE
ORAL_TABLET | Freq: Two times a day (BID) | ORAL | 0 refills | 30 days | Status: CP
Start: 2022-03-23 — End: 2022-04-23

## 2022-04-06 ENCOUNTER — Ambulatory Visit: Admit: 2022-04-06 | Discharge: 2022-04-07 | Payer: PRIVATE HEALTH INSURANCE

## 2022-04-06 DIAGNOSIS — M4726 Other spondylosis with radiculopathy, lumbar region: Principal | ICD-10-CM

## 2022-04-06 DIAGNOSIS — M5416 Radiculopathy, lumbar region: Principal | ICD-10-CM

## 2022-04-06 DIAGNOSIS — G894 Chronic pain syndrome: Principal | ICD-10-CM

## 2022-04-06 DIAGNOSIS — M792 Neuralgia and neuritis, unspecified: Principal | ICD-10-CM

## 2022-04-06 MED ORDER — BACLOFEN 5 MG TABLET
ORAL_TABLET | ORAL | 2 refills | 0.00000 days | Status: CP
Start: 2022-04-06 — End: ?

## 2022-04-06 MED ORDER — METOPROLOL SUCCINATE ER 25 MG TABLET,EXTENDED RELEASE 24 HR
ORAL_TABLET | Freq: Every day | ORAL | 6 refills | 30 days | Status: CP
Start: 2022-04-06 — End: ?

## 2022-04-06 MED ORDER — PREGABALIN 150 MG CAPSULE
ORAL_CAPSULE | Freq: Three times a day (TID) | ORAL | 5 refills | 30 days | Status: CP
Start: 2022-04-06 — End: 2022-10-03

## 2022-04-11 IMAGING — MR MRI RIGHT KNEE WITHOUT CONTRAST
4 of 5 series · 24 of 40 positions shown · IV contrast (gadolinium)
Comparison: None

________________________________________________________________________________________________ 
MRI RIGHT KNEE WITHOUT CONTRAST, 04/11/2022 [DATE]: 
CLINICAL INDICATION: Pain in right knee
TECHNIQUE: Multiplanar, multiecho position MR images of the knee were performed 
without intravenous gadolinium enhancement. Patient was scanned on a
magnet.

[Series 201: survey_ · axial · 10.0mm · 0.78mm/px · z∈[-20,+125]mm · 3 of 9 slices shown]
[im 1/9]
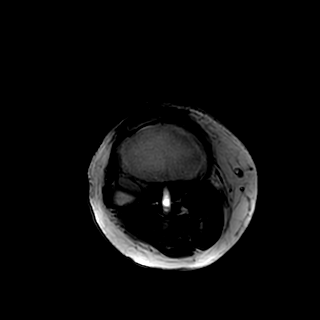
[im 5/9]
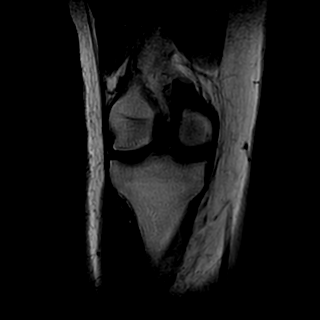
[im 9/9]
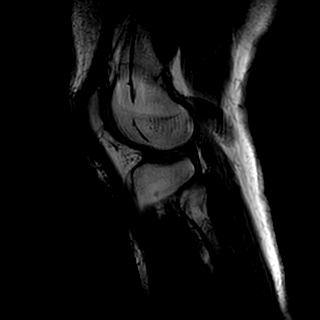

[Series 301: (person_name)_(person_name)_(person_name) · axial · 3.0mm · 0.35mm/px · z∈[-56,+66]mm · 8 of 36 slices shown]
[im 1/36]
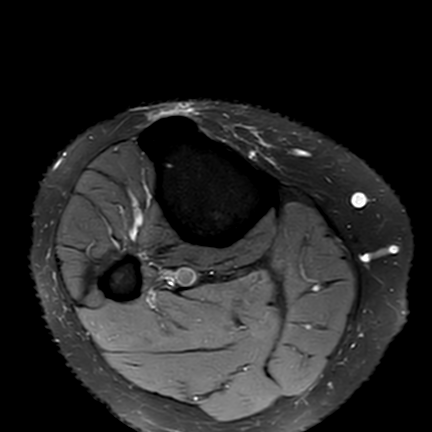
[im 4/36]
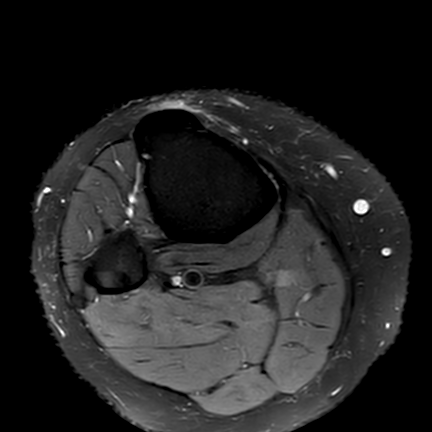
[im 12/36]
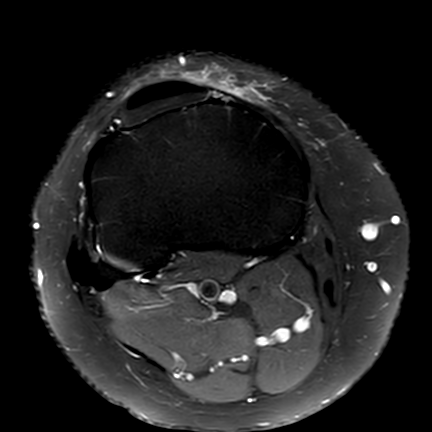
[im 16/36]
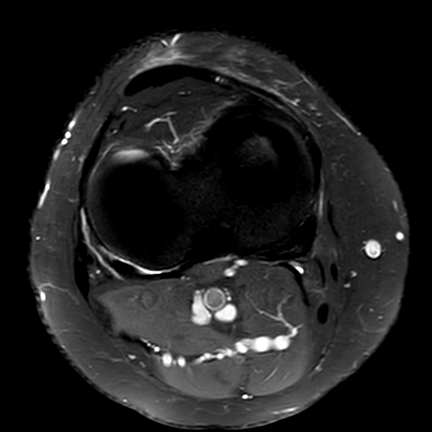
[im 20/36]
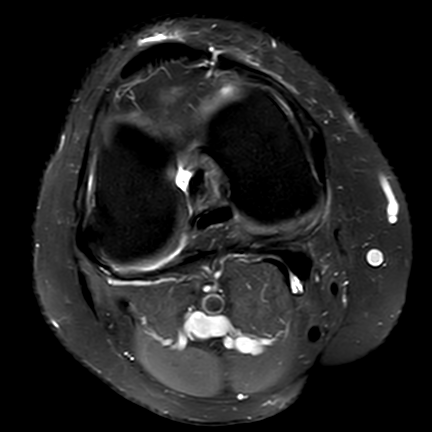
[im 24/36]
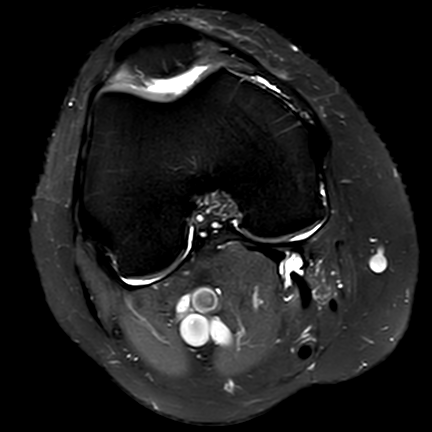
[im 32/36]
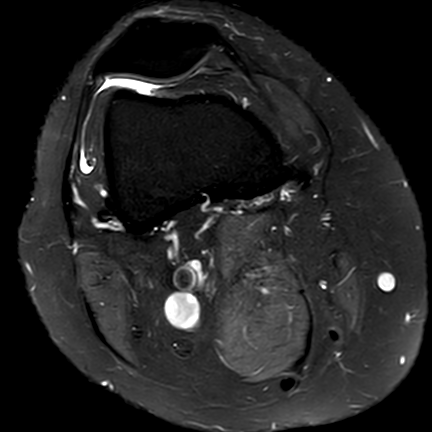
[im 36/36]
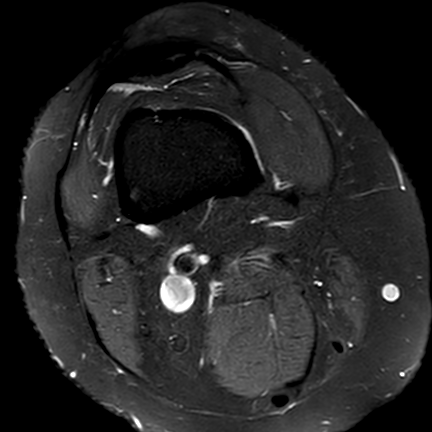

[Series 401: pd_fs_sag fh · sagittal · 3.0mm · 0.29mm/px · 9 of 32 slices shown]
[im 1/32]
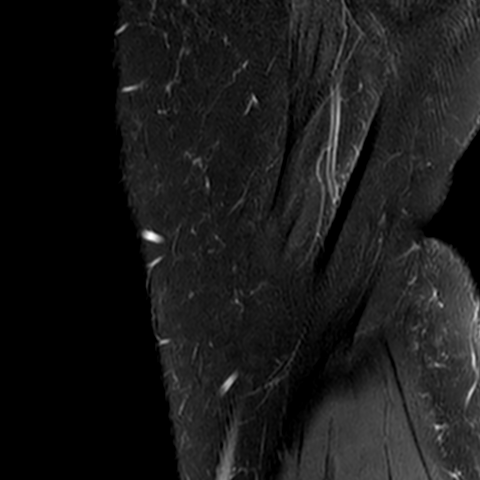
[im 4/32]
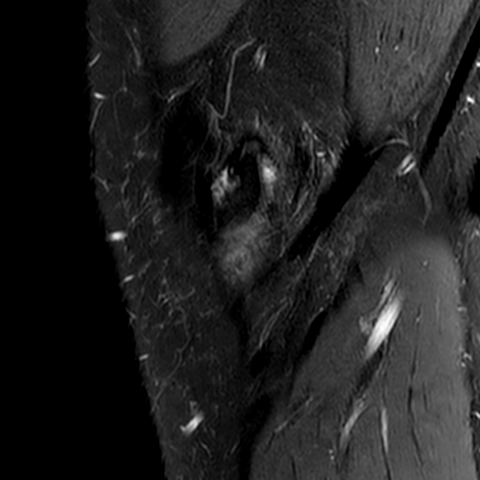
[im 8/32]
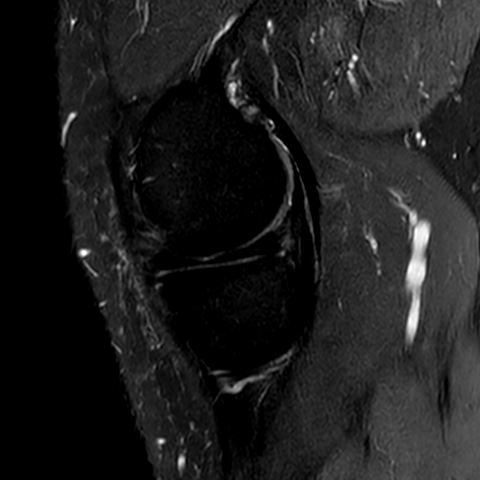
[im 12/32]
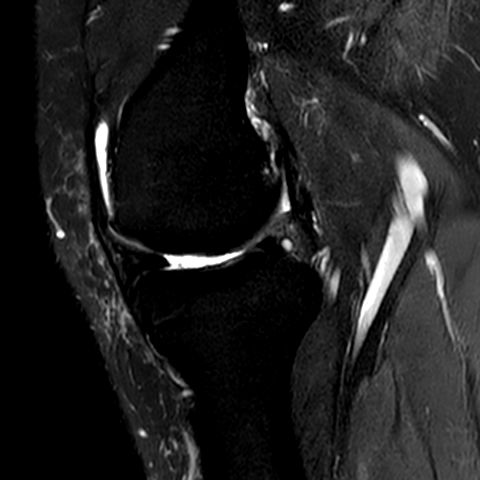
[im 16/32]
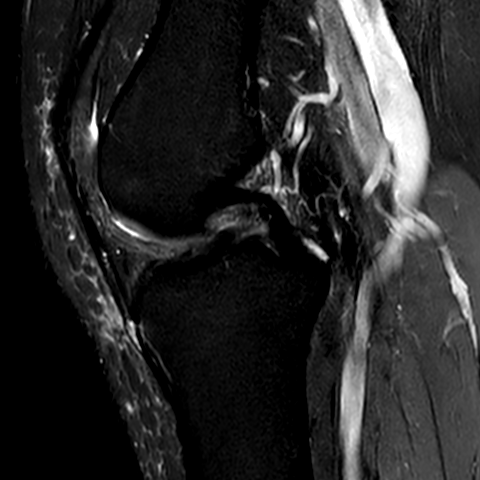
[im 20/32]
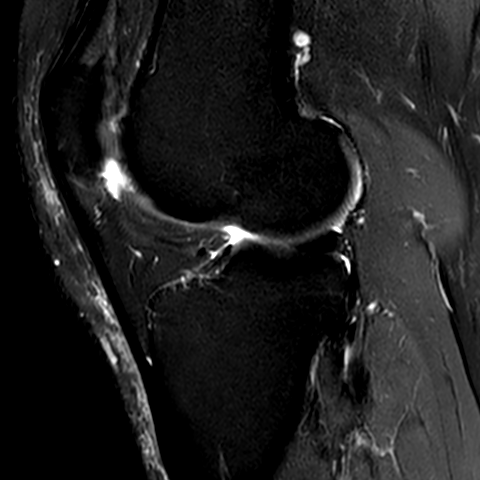
[im 24/32]
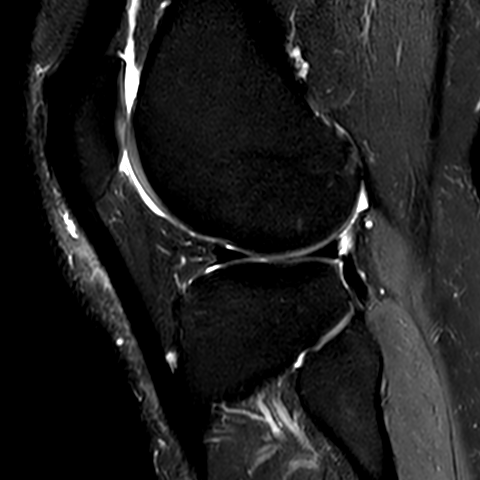
[im 28/32]
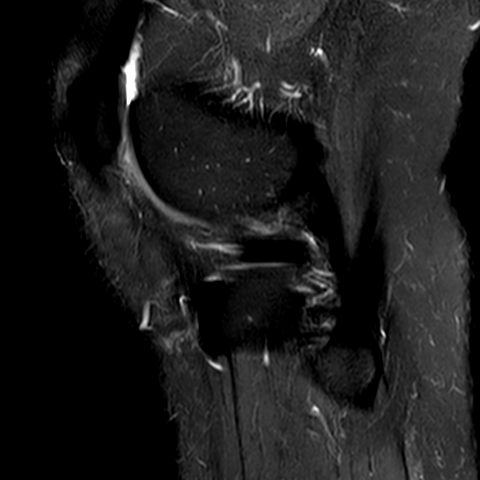
[im 32/32]
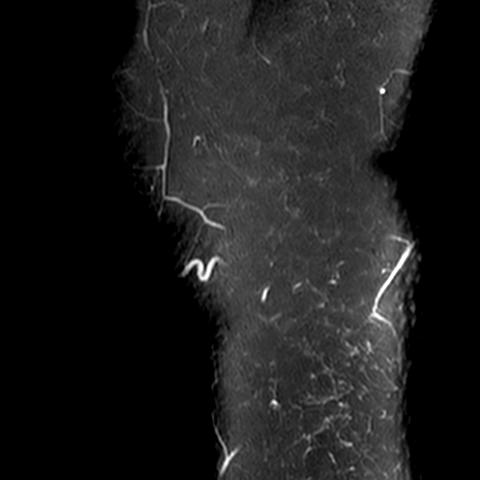

[Series 501: t1_cor · coronal · 3.0mm · 0.30mm/px · 4 of 32 slices shown]
[im 1/32]
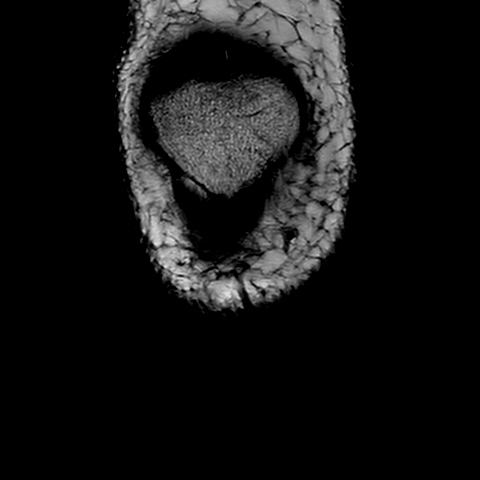
[im 4/32]
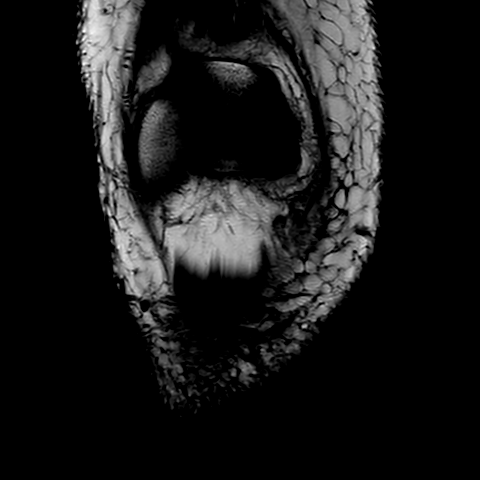
[im 16/32]
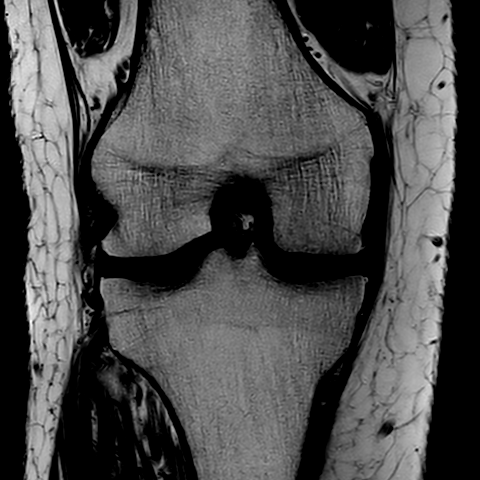
[im 28/32]
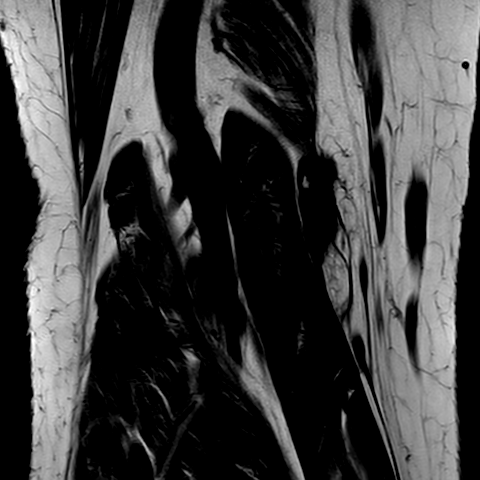

[24 of 40 positions shown; findings below may reference images not displayed]

FINDINGS: MEDIAL COMPARTMENT: Truncation of the inner margin of the medial meniscus. 
No medial meniscal extrusion. Up to grade III chondromalacia. Small posterior 
superior femoral condylar subcortical cyst. Small amount of loculated fluid in 
the posteromedial recess. Tiny osteophytes. 
LATERAL COMPARTMENT: The lateral meniscus is intact without tear or extrusion. 
Articular cartilage is preserved. Tiny osteophytes. 
PATELLOFEMORAL COMPARTMENT: The patella is centrally located. Up to grade IV 
chondromalacia and subcortical cystic change of the medial trochlea.  
TIBIOFIBULAR COMPARTMENT: Negative. 
LIGAMENTS: The anterior cruciate ligament is intact. The posterior cruciate 
ligament is intact. The medial collateral ligament and lateral collateral 
ligaments are preserved. 
EXTENSOR MECHANISM: The quadriceps and patellar tendon are preserved. The medial 
and lateral retinacula are intact. 
POSTEROMEDIAL CORNER: The semimembranosus and pes anserine tendons are 
preserved. The posterior oblique ligament and posterior medial joint capsule are 
intact. 
POSTEROLATERAL CORNER: The popliteal tendon and popliteofibular ligament are 
intact. The biceps femoris is negative. 
BONES: Normal bone marrow signal intensity. No fracture or contusion or stress 
response.  
ADDITIONAL FINDINGS: No knee joint effusion. 0.8 cm ossified body and synovitis 
in the small popliteal cyst. The musculature is normal without mass, signal 
abnormality or atrophy. Neurovascular bundles are negative. Subcutaneous tissues 
are negative.
IMPRESSION: 1.  Truncation of the inner margin of the medial meniscus. 
2.  Moderate medial compartment/patellofemoral and mild lateral compartment 
degenerative change. 
3.  0.8 cm ossified body and synovitis in the small popliteal cyst.

## 2022-04-20 MED ORDER — OXYCODONE ER 30 MG TABLET,CRUSH RESISTANT,EXTENDED RELEASE 12 HR
ORAL_TABLET | Freq: Two times a day (BID) | ORAL | 0 refills | 30.00000 days | Status: CP
Start: 2022-04-20 — End: 2022-05-20

## 2022-04-20 MED ORDER — OXYCODONE-ACETAMINOPHEN 10 MG-325 MG TABLET
ORAL_TABLET | Freq: Every day | ORAL | 0 refills | 30 days | Status: CP | PRN
Start: 2022-04-20 — End: 2022-05-20

## 2022-05-05 ENCOUNTER — Ambulatory Visit: Admit: 2022-05-05 | Discharge: 2022-05-06 | Attending: Cardiovascular Disease | Primary: Cardiovascular Disease

## 2022-05-15 DIAGNOSIS — M4726 Other spondylosis with radiculopathy, lumbar region: Principal | ICD-10-CM

## 2022-05-15 DIAGNOSIS — M5416 Radiculopathy, lumbar region: Principal | ICD-10-CM

## 2022-05-15 DIAGNOSIS — M792 Neuralgia and neuritis, unspecified: Principal | ICD-10-CM

## 2022-05-15 DIAGNOSIS — G894 Chronic pain syndrome: Principal | ICD-10-CM

## 2022-05-18 MED ORDER — OXYCODONE-ACETAMINOPHEN 10 MG-325 MG TABLET
ORAL_TABLET | ORAL | 0 refills | 0.00000 days | Status: CP
Start: 2022-05-18 — End: 2022-06-28

## 2022-05-20 MED ORDER — OXYCODONE ER 30 MG TABLET,CRUSH RESISTANT,EXTENDED RELEASE 12 HR
ORAL_TABLET | Freq: Two times a day (BID) | ORAL | 0 refills | 30.00000 days | Status: CP
Start: 2022-05-20 — End: 2022-06-19

## 2022-05-20 MED ORDER — OXYCODONE-ACETAMINOPHEN 10 MG-325 MG TABLET
ORAL_TABLET | Freq: Every day | ORAL | 0 refills | 30.00000 days | Status: CP | PRN
Start: 2022-05-20 — End: 2022-06-19

## 2022-06-01 MED ORDER — OXYCODONE ER 20 MG TABLET,CRUSH RESISTANT,EXTENDED RELEASE 12 HR
ORAL_TABLET | Freq: Two times a day (BID) | ORAL | 0 refills | 30.00000 days | Status: CP
Start: 2022-06-01 — End: 2022-06-01

## 2022-06-03 DIAGNOSIS — G894 Chronic pain syndrome: Principal | ICD-10-CM

## 2022-06-03 DIAGNOSIS — M5416 Radiculopathy, lumbar region: Principal | ICD-10-CM

## 2022-06-03 DIAGNOSIS — M792 Neuralgia and neuritis, unspecified: Principal | ICD-10-CM

## 2022-06-03 DIAGNOSIS — M4726 Other spondylosis with radiculopathy, lumbar region: Principal | ICD-10-CM

## 2022-06-03 MED ORDER — OXYCODONE-ACETAMINOPHEN 10 MG-325 MG TABLET
ORAL_TABLET | 0 refills | 0 days | Status: CP
Start: 2022-06-03 — End: 2022-07-14

## 2022-06-03 MED ORDER — OXYCODONE ER 20 MG TABLET,CRUSH RESISTANT,EXTENDED RELEASE 12 HR
ORAL_TABLET | Freq: Two times a day (BID) | ORAL | 0 refills | 30 days | Status: CP
Start: 2022-06-03 — End: 2022-07-03

## 2022-06-04 ENCOUNTER — Ambulatory Visit
Admit: 2022-06-04 | Discharge: 2022-06-05 | Payer: PRIVATE HEALTH INSURANCE | Attending: Pain Medicine | Primary: Pain Medicine

## 2022-06-04 DIAGNOSIS — M4316 Spondylolisthesis, lumbar region: Principal | ICD-10-CM

## 2022-06-04 DIAGNOSIS — M4726 Other spondylosis with radiculopathy, lumbar region: Principal | ICD-10-CM

## 2022-06-04 DIAGNOSIS — M461 Sacroiliitis, not elsewhere classified: Principal | ICD-10-CM

## 2022-06-04 DIAGNOSIS — M47816 Spondylosis without myelopathy or radiculopathy, lumbar region: Principal | ICD-10-CM

## 2022-06-04 DIAGNOSIS — M792 Neuralgia and neuritis, unspecified: Principal | ICD-10-CM

## 2022-06-04 DIAGNOSIS — M5416 Radiculopathy, lumbar region: Principal | ICD-10-CM

## 2022-06-04 DIAGNOSIS — G894 Chronic pain syndrome: Principal | ICD-10-CM

## 2022-06-04 MED ORDER — OXYCODONE-ACETAMINOPHEN 10 MG-325 MG TABLET
ORAL_TABLET | Freq: Three times a day (TID) | ORAL | 0 refills | 30 days | Status: CP | PRN
Start: 2022-06-04 — End: 2022-07-04

## 2022-06-04 MED ORDER — MORPHINE ER 30 MG TABLET,EXTENDED RELEASE
ORAL_TABLET | Freq: Two times a day (BID) | ORAL | 0 refills | 30 days | Status: CP
Start: 2022-06-04 — End: 2022-07-04

## 2022-06-15 DIAGNOSIS — G894 Chronic pain syndrome: Principal | ICD-10-CM

## 2022-06-15 MED ORDER — BACLOFEN 5 MG TABLET
ORAL_TABLET | 2 refills | 0 days | Status: CP
Start: 2022-06-15 — End: ?

## 2022-07-02 ENCOUNTER — Ambulatory Visit: Admit: 2022-07-02 | Discharge: 2022-07-03 | Payer: PRIVATE HEALTH INSURANCE

## 2022-07-02 DIAGNOSIS — Z981 Arthrodesis status: Principal | ICD-10-CM

## 2022-07-02 DIAGNOSIS — M4726 Other spondylosis with radiculopathy, lumbar region: Principal | ICD-10-CM

## 2022-07-02 DIAGNOSIS — G8929 Other chronic pain: Principal | ICD-10-CM

## 2022-07-02 DIAGNOSIS — M792 Neuralgia and neuritis, unspecified: Principal | ICD-10-CM

## 2022-07-02 DIAGNOSIS — G894 Chronic pain syndrome: Principal | ICD-10-CM

## 2022-07-02 DIAGNOSIS — M461 Sacroiliitis, not elsewhere classified: Principal | ICD-10-CM

## 2022-07-02 DIAGNOSIS — M5416 Radiculopathy, lumbar region: Principal | ICD-10-CM

## 2022-07-02 MED ORDER — BACLOFEN 5 MG TABLET
ORAL_TABLET | 5 refills | 0 days | Status: CP
Start: 2022-07-02 — End: 2023-01-13

## 2022-07-02 MED ORDER — PREGABALIN 150 MG CAPSULE
ORAL_CAPSULE | Freq: Three times a day (TID) | ORAL | 5 refills | 20.00000 days | Status: CP | PRN
Start: 2022-07-02 — End: 2022-12-29

## 2022-07-03 MED ORDER — OXYCODONE-ACETAMINOPHEN 10 MG-325 MG TABLET
ORAL_TABLET | Freq: Three times a day (TID) | ORAL | 0 refills | 30 days | Status: CP | PRN
Start: 2022-07-03 — End: 2022-08-02

## 2022-07-03 MED ORDER — MORPHINE ER 30 MG TABLET,EXTENDED RELEASE
ORAL_TABLET | Freq: Two times a day (BID) | ORAL | 0 refills | 30 days | Status: CP
Start: 2022-07-03 — End: 2022-08-02

## 2022-07-31 DIAGNOSIS — G894 Chronic pain syndrome: Principal | ICD-10-CM

## 2022-07-31 DIAGNOSIS — M792 Neuralgia and neuritis, unspecified: Principal | ICD-10-CM

## 2022-07-31 DIAGNOSIS — M4726 Other spondylosis with radiculopathy, lumbar region: Principal | ICD-10-CM

## 2022-07-31 DIAGNOSIS — M5416 Radiculopathy, lumbar region: Principal | ICD-10-CM

## 2022-07-31 DIAGNOSIS — G8929 Other chronic pain: Principal | ICD-10-CM

## 2022-08-02 MED ORDER — MORPHINE ER 30 MG TABLET,EXTENDED RELEASE
ORAL_TABLET | Freq: Two times a day (BID) | ORAL | 0 refills | 30 days | Status: CP
Start: 2022-08-02 — End: 2022-09-01

## 2022-08-02 MED ORDER — OXYCODONE-ACETAMINOPHEN 10 MG-325 MG TABLET
ORAL_TABLET | Freq: Three times a day (TID) | ORAL | 0 refills | 30 days | Status: CP | PRN
Start: 2022-08-02 — End: 2022-09-01

## 2022-08-03 MED ORDER — MORPHINE ER 30 MG TABLET,EXTENDED RELEASE
ORAL_TABLET | Freq: Two times a day (BID) | ORAL | 0 refills | 30.00000 days | Status: CP
Start: 2022-08-03 — End: 2022-06-04

## 2022-09-01 MED ORDER — OXYCODONE-ACETAMINOPHEN 10 MG-325 MG TABLET
ORAL_TABLET | Freq: Three times a day (TID) | ORAL | 0 refills | 30 days | Status: CP | PRN
Start: 2022-09-01 — End: 2022-10-01

## 2022-09-01 MED ORDER — MORPHINE ER 30 MG TABLET,EXTENDED RELEASE
ORAL_TABLET | Freq: Two times a day (BID) | ORAL | 0 refills | 30 days | Status: CP
Start: 2022-09-01 — End: 2022-10-01

## 2022-09-17 ENCOUNTER — Ambulatory Visit: Admit: 2022-09-17 | Discharge: 2022-09-18 | Payer: PRIVATE HEALTH INSURANCE

## 2022-09-17 DIAGNOSIS — M792 Neuralgia and neuritis, unspecified: Principal | ICD-10-CM

## 2022-09-17 DIAGNOSIS — F1190 Opioid use, unspecified, uncomplicated: Principal | ICD-10-CM

## 2022-09-17 DIAGNOSIS — Z981 Arthrodesis status: Principal | ICD-10-CM

## 2022-09-17 DIAGNOSIS — M5416 Radiculopathy, lumbar region: Principal | ICD-10-CM

## 2022-09-17 DIAGNOSIS — G8929 Other chronic pain: Principal | ICD-10-CM

## 2022-09-17 DIAGNOSIS — F32A Depression, unspecified depression type: Principal | ICD-10-CM

## 2022-09-17 MED ORDER — KETOROLAC 10 MG TABLET
ORAL_TABLET | Freq: Four times a day (QID) | ORAL | 0 refills | 5 days | Status: CP | PRN
Start: 2022-09-17 — End: 2022-12-16

## 2022-09-17 MED ORDER — NALOXONE 4 MG/ACTUATION NASAL SPRAY
1 refills | 0 days | Status: CP
Start: 2022-09-17 — End: ?

## 2022-10-01 MED ORDER — MORPHINE ER 30 MG TABLET,EXTENDED RELEASE
ORAL_TABLET | Freq: Two times a day (BID) | ORAL | 0 refills | 30 days | Status: CP
Start: 2022-10-01 — End: 2022-10-31

## 2022-10-01 MED ORDER — OXYCODONE-ACETAMINOPHEN 10 MG-325 MG TABLET
ORAL_TABLET | Freq: Three times a day (TID) | ORAL | 0 refills | 30 days | Status: CP | PRN
Start: 2022-10-01 — End: 2022-10-31

## 2022-10-31 MED ORDER — MORPHINE ER 30 MG TABLET,EXTENDED RELEASE
ORAL_TABLET | Freq: Two times a day (BID) | ORAL | 0 refills | 30 days | Status: CP
Start: 2022-10-31 — End: 2022-11-30

## 2022-10-31 MED ORDER — OXYCODONE-ACETAMINOPHEN 10 MG-325 MG TABLET
ORAL_TABLET | Freq: Three times a day (TID) | ORAL | 0 refills | 30 days | Status: CP | PRN
Start: 2022-10-31 — End: 2022-11-30

## 2022-11-24 ENCOUNTER — Ambulatory Visit: Admit: 2022-11-24 | Discharge: 2022-11-25 | Payer: PRIVATE HEALTH INSURANCE

## 2022-11-24 DIAGNOSIS — M5416 Radiculopathy, lumbar region: Principal | ICD-10-CM

## 2022-11-24 DIAGNOSIS — M792 Neuralgia and neuritis, unspecified: Principal | ICD-10-CM

## 2022-11-24 DIAGNOSIS — G8929 Other chronic pain: Principal | ICD-10-CM

## 2022-11-24 DIAGNOSIS — M4726 Other spondylosis with radiculopathy, lumbar region: Principal | ICD-10-CM

## 2022-11-24 MED ORDER — PREGABALIN 150 MG CAPSULE
ORAL_CAPSULE | Freq: Three times a day (TID) | ORAL | 5 refills | 20 days | Status: CP | PRN
Start: 2022-11-24 — End: 2023-05-23

## 2022-11-24 MED ORDER — BACLOFEN 5 MG TABLET
ORAL_TABLET | 5 refills | 0 days | Status: CP
Start: 2022-11-24 — End: 2023-06-06

## 2022-11-30 MED ORDER — MORPHINE ER 30 MG TABLET,EXTENDED RELEASE
ORAL_TABLET | Freq: Two times a day (BID) | ORAL | 0 refills | 30 days | Status: CP
Start: 2022-11-30 — End: 2022-12-30

## 2022-11-30 MED ORDER — OXYCODONE-ACETAMINOPHEN 10 MG-325 MG TABLET
ORAL_TABLET | Freq: Three times a day (TID) | ORAL | 0 refills | 30 days | Status: CP | PRN
Start: 2022-11-30 — End: 2022-12-30

## 2022-12-30 MED ORDER — MORPHINE ER 30 MG TABLET,EXTENDED RELEASE
ORAL_TABLET | Freq: Two times a day (BID) | ORAL | 0 refills | 30 days | Status: CP
Start: 2022-12-30 — End: 2023-01-29

## 2022-12-30 MED ORDER — OXYCODONE-ACETAMINOPHEN 10 MG-325 MG TABLET
ORAL_TABLET | Freq: Three times a day (TID) | ORAL | 0 refills | 30 days | Status: CP | PRN
Start: 2022-12-30 — End: 2023-01-29

## 2023-01-25 ENCOUNTER — Ambulatory Visit: Admit: 2023-01-25 | Discharge: 2023-01-26 | Payer: PRIVATE HEALTH INSURANCE

## 2023-01-25 DIAGNOSIS — G8929 Other chronic pain: Principal | ICD-10-CM

## 2023-01-25 DIAGNOSIS — M792 Neuralgia and neuritis, unspecified: Principal | ICD-10-CM

## 2023-01-25 DIAGNOSIS — M4726 Other spondylosis with radiculopathy, lumbar region: Principal | ICD-10-CM

## 2023-01-25 DIAGNOSIS — M5416 Radiculopathy, lumbar region: Principal | ICD-10-CM

## 2023-01-27 MED ORDER — OXYCODONE ER 20 MG TABLET,CRUSH RESISTANT,EXTENDED RELEASE 12 HR
ORAL_TABLET | Freq: Two times a day (BID) | ORAL | 0 refills | 30 days | Status: CP
Start: 2023-01-27 — End: 2023-02-26

## 2023-01-29 MED ORDER — OXYCODONE ER 20 MG TABLET,CRUSH RESISTANT,EXTENDED RELEASE 12 HR
ORAL_TABLET | Freq: Two times a day (BID) | ORAL | 0 refills | 30 days | Status: CP
Start: 2023-01-29 — End: 2023-01-25

## 2023-01-29 MED ORDER — OXYCODONE-ACETAMINOPHEN 10 MG-325 MG TABLET
ORAL_TABLET | Freq: Three times a day (TID) | ORAL | 0 refills | 30 days | Status: CP | PRN
Start: 2023-01-29 — End: 2023-02-28

## 2023-02-27 MED ORDER — OXYCODONE ER 20 MG TABLET,CRUSH RESISTANT,EXTENDED RELEASE 12 HR
ORAL_TABLET | Freq: Two times a day (BID) | ORAL | 0 refills | 30 days | Status: CP
Start: 2023-02-27 — End: 2023-03-29

## 2023-02-27 MED ORDER — OXYCODONE-ACETAMINOPHEN 10 MG-325 MG TABLET
ORAL_TABLET | Freq: Three times a day (TID) | ORAL | 0 refills | 30 days | Status: CP | PRN
Start: 2023-02-27 — End: 2023-03-30

## 2023-03-12 ENCOUNTER — Ambulatory Visit: Admit: 2023-03-12 | Discharge: 2023-03-13 | Payer: PRIVATE HEALTH INSURANCE | Attending: Medical | Primary: Medical

## 2023-03-12 DIAGNOSIS — R5383 Other fatigue: Principal | ICD-10-CM

## 2023-03-12 DIAGNOSIS — R55 Syncope and collapse: Principal | ICD-10-CM

## 2023-03-12 DIAGNOSIS — E782 Mixed hyperlipidemia: Principal | ICD-10-CM

## 2023-03-29 ENCOUNTER — Ambulatory Visit: Admit: 2023-03-29 | Discharge: 2023-03-30 | Payer: PRIVATE HEALTH INSURANCE

## 2023-03-29 DIAGNOSIS — M4726 Other spondylosis with radiculopathy, lumbar region: Principal | ICD-10-CM

## 2023-03-29 DIAGNOSIS — G8929 Other chronic pain: Principal | ICD-10-CM

## 2023-03-29 DIAGNOSIS — M5416 Radiculopathy, lumbar region: Principal | ICD-10-CM

## 2023-03-29 DIAGNOSIS — M792 Neuralgia and neuritis, unspecified: Principal | ICD-10-CM

## 2023-03-29 MED ORDER — OXYCODONE ER 20 MG TABLET,CRUSH RESISTANT,EXTENDED RELEASE 12 HR
ORAL_TABLET | Freq: Two times a day (BID) | ORAL | 0 refills | 30 days | Status: CP
Start: 2023-03-29 — End: 2023-04-28

## 2023-03-29 MED ORDER — PREGABALIN 150 MG CAPSULE
ORAL_CAPSULE | Freq: Three times a day (TID) | ORAL | 5 refills | 20 days | Status: CP | PRN
Start: 2023-03-29 — End: 2023-09-25

## 2023-04-03 MED ORDER — OXYCODONE-ACETAMINOPHEN 10 MG-325 MG TABLET
ORAL_TABLET | Freq: Three times a day (TID) | ORAL | 0 refills | 25 days | Status: CP | PRN
Start: 2023-04-03 — End: 2023-04-28

## 2023-04-23 ENCOUNTER — Ambulatory Visit
Admit: 2023-04-23 | Discharge: 2023-04-23 | Discharge status: Against Medical Advice | Payer: PRIVATE HEALTH INSURANCE | Attending: Emergency Medicine

## 2023-04-23 DIAGNOSIS — R55 Syncope and collapse: Principal | ICD-10-CM

## 2023-04-23 DIAGNOSIS — E782 Mixed hyperlipidemia: Principal | ICD-10-CM

## 2023-04-23 DIAGNOSIS — R5383 Other fatigue: Principal | ICD-10-CM

## 2023-04-28 MED ORDER — OXYCODONE-ACETAMINOPHEN 10 MG-325 MG TABLET
ORAL_TABLET | Freq: Three times a day (TID) | ORAL | 0 refills | 30 days | Status: CP | PRN
Start: 2023-04-28 — End: 2023-05-28

## 2023-04-28 MED ORDER — OXYCODONE ER 20 MG TABLET,CRUSH RESISTANT,EXTENDED RELEASE 12 HR
ORAL_TABLET | Freq: Two times a day (BID) | ORAL | 0 refills | 30 days | Status: CP
Start: 2023-04-28 — End: 2023-05-28

## 2023-05-11 ENCOUNTER — Ambulatory Visit: Admit: 2023-05-11 | Discharge: 2023-05-12 | Payer: PRIVATE HEALTH INSURANCE | Attending: Medical | Primary: Medical

## 2023-05-11 DIAGNOSIS — R55 Syncope and collapse: Principal | ICD-10-CM

## 2023-05-11 DIAGNOSIS — R011 Cardiac murmur, unspecified: Principal | ICD-10-CM

## 2023-05-25 ENCOUNTER — Ambulatory Visit: Admit: 2023-05-25 | Payer: PRIVATE HEALTH INSURANCE

## 2023-05-26 ENCOUNTER — Ambulatory Visit: Admit: 2023-05-26 | Discharge: 2023-05-27 | Payer: PRIVATE HEALTH INSURANCE

## 2023-05-26 DIAGNOSIS — Z981 Arthrodesis status: Principal | ICD-10-CM

## 2023-05-26 DIAGNOSIS — G8929 Other chronic pain: Principal | ICD-10-CM

## 2023-05-26 DIAGNOSIS — M5416 Radiculopathy, lumbar region: Principal | ICD-10-CM

## 2023-05-26 DIAGNOSIS — M792 Neuralgia and neuritis, unspecified: Principal | ICD-10-CM

## 2023-05-26 MED ORDER — BACLOFEN 5 MG TABLET
ORAL_TABLET | 5 refills | 0 days | Status: CP
Start: 2023-05-26 — End: 2023-12-05

## 2023-05-28 MED ORDER — OXYCODONE-ACETAMINOPHEN 10 MG-325 MG TABLET
ORAL | 0 refills | 35 days | Status: CP | PRN
Start: 2023-05-28 — End: 2023-07-02

## 2023-06-02 MED ORDER — OXYCODONE ER 20 MG TABLET,CRUSH RESISTANT,EXTENDED RELEASE 12 HR
ORAL_TABLET | Freq: Two times a day (BID) | ORAL | 0 refills | 30 days | Status: CP
Start: 2023-06-02 — End: 2023-07-02

## 2023-06-14 ENCOUNTER — Ambulatory Visit: Admit: 2023-06-14 | Discharge: 2023-06-15 | Payer: PRIVATE HEALTH INSURANCE | Attending: Medical | Primary: Medical

## 2023-06-14 DIAGNOSIS — R55 Syncope and collapse: Principal | ICD-10-CM

## 2023-06-14 DIAGNOSIS — R079 Chest pain, unspecified: Principal | ICD-10-CM

## 2023-06-14 DIAGNOSIS — E782 Mixed hyperlipidemia: Principal | ICD-10-CM

## 2023-06-14 DIAGNOSIS — R0602 Shortness of breath: Principal | ICD-10-CM

## 2023-07-02 MED ORDER — OXYCODONE ER 20 MG TABLET,CRUSH RESISTANT,EXTENDED RELEASE 12 HR
ORAL_TABLET | Freq: Two times a day (BID) | ORAL | 0 refills | 30 days | Status: CP
Start: 2023-07-02 — End: 2023-08-01

## 2023-07-02 MED ORDER — OXYCODONE-ACETAMINOPHEN 10 MG-325 MG TABLET
ORAL | 0 refills | 30 days | Status: CP | PRN
Start: 2023-07-02 — End: 2023-08-01

## 2023-07-25 ENCOUNTER — Emergency Department
Admit: 2023-07-25 | Discharge: 2023-07-25 | Disposition: A | Discharge status: Home / Self Care | Payer: PRIVATE HEALTH INSURANCE | Attending: Emergency Medicine

## 2023-07-25 ENCOUNTER — Ambulatory Visit
Admit: 2023-07-25 | Discharge: 2023-07-25 | Disposition: A | Discharge status: Home / Self Care | Payer: PRIVATE HEALTH INSURANCE | Attending: Emergency Medicine

## 2023-07-25 DIAGNOSIS — U071 COVID-19: Principal | ICD-10-CM

## 2023-07-25 DIAGNOSIS — R112 Nausea with vomiting, unspecified: Principal | ICD-10-CM

## 2023-07-25 MED ORDER — PROMETHAZINE 25 MG RECTAL SUPPOSITORY
Freq: Four times a day (QID) | RECTAL | 0 refills | 4 days | Status: CP | PRN
Start: 2023-07-25 — End: 2023-07-29

## 2023-07-25 MED ORDER — ONDANSETRON 4 MG DISINTEGRATING TABLET
ORAL_TABLET | Freq: Three times a day (TID) | ORAL | 0 refills | 3 days | Status: CP | PRN
Start: 2023-07-25 — End: 2023-07-29

## 2023-07-26 DIAGNOSIS — G8929 Other chronic pain: Principal | ICD-10-CM

## 2023-07-26 DIAGNOSIS — M5416 Radiculopathy, lumbar region: Principal | ICD-10-CM

## 2023-07-26 DIAGNOSIS — M792 Neuralgia and neuritis, unspecified: Principal | ICD-10-CM

## 2023-08-01 MED ORDER — OXYCODONE-ACETAMINOPHEN 10 MG-325 MG TABLET
ORAL_TABLET | Freq: Three times a day (TID) | ORAL | 0 refills | 35 days | Status: CP | PRN
Start: 2023-08-01 — End: 2023-09-05

## 2023-08-01 MED ORDER — OXYCODONE ER 20 MG TABLET,CRUSH RESISTANT,EXTENDED RELEASE 12 HR
ORAL_TABLET | Freq: Two times a day (BID) | ORAL | 0 refills | 30 days | Status: CP
Start: 2023-08-01 — End: 2023-08-31

## 2023-08-04 ENCOUNTER — Ambulatory Visit: Admit: 2023-08-04 | Discharge: 2023-08-05 | Payer: PRIVATE HEALTH INSURANCE

## 2023-08-04 DIAGNOSIS — G8929 Other chronic pain: Principal | ICD-10-CM

## 2023-08-04 DIAGNOSIS — M5416 Radiculopathy, lumbar region: Principal | ICD-10-CM

## 2023-08-04 DIAGNOSIS — M792 Neuralgia and neuritis, unspecified: Principal | ICD-10-CM

## 2023-08-04 MED ORDER — OXYCODONE ER 20 MG TABLET,CRUSH RESISTANT,EXTENDED RELEASE 12 HR
ORAL_TABLET | Freq: Two times a day (BID) | ORAL | 0 refills | 30 days | Status: CP
Start: 2023-08-04 — End: 2023-09-03

## 2023-08-04 MED ORDER — PREGABALIN 150 MG CAPSULE
ORAL_CAPSULE | Freq: Three times a day (TID) | ORAL | 5 refills | 20 days | Status: CP | PRN
Start: 2023-08-04 — End: 2024-01-31

## 2023-09-01 MED ORDER — OXYCODONE-ACETAMINOPHEN 10 MG-325 MG TABLET
ORAL_TABLET | Freq: Three times a day (TID) | ORAL | 0 refills | 30 days | Status: CP | PRN
Start: 2023-09-01 — End: 2023-10-01

## 2023-09-03 MED ORDER — OXYCODONE ER 20 MG TABLET,CRUSH RESISTANT,EXTENDED RELEASE 12 HR
ORAL_TABLET | Freq: Two times a day (BID) | ORAL | 0 refills | 30 days | Status: CP
Start: 2023-09-03 — End: 2023-10-03

## 2023-09-21 DIAGNOSIS — M961 Postlaminectomy syndrome, not elsewhere classified: Principal | ICD-10-CM

## 2023-09-29 ENCOUNTER — Ambulatory Visit: Admit: 2023-09-29 | Discharge: 2023-09-30 | Payer: PRIVATE HEALTH INSURANCE

## 2023-09-29 DIAGNOSIS — M792 Neuralgia and neuritis, unspecified: Principal | ICD-10-CM

## 2023-09-29 DIAGNOSIS — R413 Other amnesia: Principal | ICD-10-CM

## 2023-09-29 DIAGNOSIS — Z981 Arthrodesis status: Principal | ICD-10-CM

## 2023-09-29 DIAGNOSIS — G8929 Other chronic pain: Principal | ICD-10-CM

## 2023-09-29 DIAGNOSIS — M5416 Radiculopathy, lumbar region: Principal | ICD-10-CM

## 2023-09-29 MED ORDER — BACLOFEN 5 MG TABLET
ORAL_TABLET | 5 refills | 0.00 days | Status: CP
Start: 2023-09-29 — End: 2024-04-09

## 2023-09-29 MED ORDER — PREGABALIN 150 MG CAPSULE
ORAL_CAPSULE | Freq: Three times a day (TID) | ORAL | 5 refills | 30.00 days | Status: CP | PRN
Start: 2023-09-29 — End: 2024-03-27

## 2023-10-02 MED ORDER — OXYCODONE ER 20 MG TABLET,CRUSH RESISTANT,EXTENDED RELEASE 12 HR
ORAL_TABLET | Freq: Two times a day (BID) | ORAL | 0 refills | 30.00 days | Status: CP
Start: 2023-10-02 — End: 2023-11-01

## 2023-10-02 MED ORDER — OXYCODONE-ACETAMINOPHEN 10 MG-325 MG TABLET
ORAL_TABLET | Freq: Three times a day (TID) | ORAL | 0 refills | 30 days | Status: CP | PRN
Start: 2023-10-02 — End: 2023-11-01

## 2023-10-18 ENCOUNTER — Ambulatory Visit: Admit: 2023-10-18 | Discharge: 2023-10-19 | Payer: PRIVATE HEALTH INSURANCE | Attending: Medical | Primary: Medical

## 2023-10-18 DIAGNOSIS — R5383 Other fatigue: Principal | ICD-10-CM

## 2023-10-18 DIAGNOSIS — E782 Mixed hyperlipidemia: Principal | ICD-10-CM

## 2023-10-18 DIAGNOSIS — R0602 Shortness of breath: Principal | ICD-10-CM

## 2023-10-18 DIAGNOSIS — R55 Syncope and collapse: Principal | ICD-10-CM

## 2023-11-01 MED ORDER — OXYCODONE ER 20 MG TABLET,CRUSH RESISTANT,EXTENDED RELEASE 12 HR
ORAL_TABLET | Freq: Two times a day (BID) | ORAL | 0 refills | 30.00 days | Status: CP
Start: 2023-11-01 — End: 2023-12-01

## 2023-11-01 MED ORDER — OXYCODONE-ACETAMINOPHEN 10 MG-325 MG TABLET
ORAL_TABLET | Freq: Three times a day (TID) | ORAL | 0 refills | 35 days | Status: CP | PRN
Start: 2023-11-01 — End: 2023-12-06

## 2023-11-25 ENCOUNTER — Ambulatory Visit: Admit: 2023-11-25 | Discharge: 2023-11-26 | Payer: PRIVATE HEALTH INSURANCE

## 2023-11-25 DIAGNOSIS — G8929 Other chronic pain: Principal | ICD-10-CM

## 2023-11-25 DIAGNOSIS — Z981 Arthrodesis status: Principal | ICD-10-CM

## 2023-11-25 DIAGNOSIS — M5416 Radiculopathy, lumbar region: Principal | ICD-10-CM

## 2023-11-25 DIAGNOSIS — M792 Neuralgia and neuritis, unspecified: Principal | ICD-10-CM

## 2023-12-01 MED ORDER — OXYCODONE ER 20 MG TABLET,CRUSH RESISTANT,EXTENDED RELEASE 12 HR
ORAL_TABLET | Freq: Two times a day (BID) | ORAL | 0 refills | 30.00 days | Status: CP
Start: 2023-12-01 — End: 2023-12-31

## 2023-12-01 MED ORDER — OXYCODONE-ACETAMINOPHEN 10 MG-325 MG TABLET
ORAL_TABLET | Freq: Three times a day (TID) | ORAL | 0 refills | 30 days | Status: CP | PRN
Start: 2023-12-01 — End: 2023-12-31

## 2023-12-31 MED ORDER — OXYCODONE ER 20 MG TABLET,CRUSH RESISTANT,EXTENDED RELEASE 12 HR
ORAL_TABLET | Freq: Two times a day (BID) | ORAL | 0 refills | 30.00 days | Status: CP
Start: 2023-12-31 — End: 2024-01-30

## 2023-12-31 MED ORDER — OXYCODONE-ACETAMINOPHEN 10 MG-325 MG TABLET
ORAL_TABLET | Freq: Three times a day (TID) | ORAL | 0 refills | 30 days | Status: CP | PRN
Start: 2023-12-31 — End: 2024-02-04

## 2024-01-24 ENCOUNTER — Ambulatory Visit: Admit: 2024-01-24 | Discharge: 2024-01-25 | Payer: Medicaid (Managed Care)

## 2024-01-24 DIAGNOSIS — M5416 Radiculopathy, lumbar region: Principal | ICD-10-CM

## 2024-01-24 DIAGNOSIS — Z981 Arthrodesis status: Principal | ICD-10-CM

## 2024-01-24 DIAGNOSIS — G8929 Other chronic pain: Principal | ICD-10-CM

## 2024-01-24 DIAGNOSIS — M792 Neuralgia and neuritis, unspecified: Principal | ICD-10-CM

## 2024-01-29 MED ORDER — OXYCODONE ER 20 MG TABLET,CRUSH RESISTANT,EXTENDED RELEASE 12 HR
ORAL_TABLET | Freq: Two times a day (BID) | ORAL | 0 refills | 30.00000 days | Status: CP
Start: 2024-01-29 — End: 2024-02-29

## 2024-02-09 MED ORDER — OXYCODONE-ACETAMINOPHEN 10 MG-325 MG TABLET
ORAL_TABLET | Freq: Three times a day (TID) | ORAL | 0 refills | 30.00000 days | Status: CP | PRN
Start: 2024-02-09 — End: 2024-03-10

## 2024-02-21 ENCOUNTER — Ambulatory Visit: Admit: 2024-02-21 | Discharge: 2024-02-22 | Payer: Medicaid (Managed Care)

## 2024-02-21 DIAGNOSIS — M792 Neuralgia and neuritis, unspecified: Principal | ICD-10-CM

## 2024-02-21 DIAGNOSIS — M5416 Radiculopathy, lumbar region: Principal | ICD-10-CM

## 2024-02-21 DIAGNOSIS — G8929 Other chronic pain: Principal | ICD-10-CM

## 2024-02-21 MED ORDER — BACLOFEN 5 MG TABLET
ORAL_TABLET | ORAL | 5 refills | 0.00000 days | Status: CP
Start: 2024-02-21 — End: 2024-09-01

## 2024-02-21 MED ORDER — KETOROLAC 10 MG TABLET
ORAL_TABLET | Freq: Every day | ORAL | 2 refills | 30.00000 days | Status: CP | PRN
Start: 2024-02-21 — End: 2024-05-21

## 2024-02-29 MED ORDER — OXYCODONE ER 20 MG TABLET,CRUSH RESISTANT,EXTENDED RELEASE 12 HR
ORAL_TABLET | Freq: Two times a day (BID) | ORAL | 0 refills | 30.00000 days | Status: CP
Start: 2024-02-29 — End: 2024-03-30

## 2024-03-10 MED ORDER — OXYCODONE-ACETAMINOPHEN 10 MG-325 MG TABLET
ORAL_TABLET | Freq: Three times a day (TID) | ORAL | 0 refills | 30.00000 days | Status: CP | PRN
Start: 2024-03-10 — End: 2024-04-09

## 2024-03-30 MED ORDER — OXYCODONE ER 20 MG TABLET,CRUSH RESISTANT,EXTENDED RELEASE 12 HR
ORAL_TABLET | Freq: Two times a day (BID) | ORAL | 0 refills | 30.00000 days | Status: CP
Start: 2024-03-30 — End: 2024-04-29

## 2024-04-03 DIAGNOSIS — G8929 Other chronic pain: Principal | ICD-10-CM

## 2024-04-09 MED ORDER — OXYCODONE-ACETAMINOPHEN 10 MG-325 MG TABLET
ORAL_TABLET | Freq: Three times a day (TID) | ORAL | 0 refills | 30.00000 days | Status: CP | PRN
Start: 2024-04-09 — End: 2024-05-09

## 2024-04-18 MED ORDER — MIDODRINE 2.5 MG TABLET
ORAL_TABLET | Freq: Three times a day (TID) | ORAL | 0 refills | 30.00000 days | Status: CP
Start: 2024-04-18 — End: 2024-05-18

## 2024-05-19 MED ORDER — MIDODRINE 2.5 MG TABLET
ORAL_TABLET | Freq: Three times a day (TID) | ORAL | 0 refills | 30.00000 days | Status: CP
Start: 2024-05-19 — End: 2024-06-18

## 2024-06-27 MED ORDER — MIDODRINE 2.5 MG TABLET
ORAL_TABLET | Freq: Three times a day (TID) | ORAL | 0 refills | 30.00000 days | Status: CP
Start: 2024-06-27 — End: ?

## 2024-07-18 DIAGNOSIS — R55 Syncope and collapse: Principal | ICD-10-CM

## 2024-07-18 DIAGNOSIS — R0602 Shortness of breath: Principal | ICD-10-CM

## 2024-07-18 DIAGNOSIS — E782 Mixed hyperlipidemia: Principal | ICD-10-CM

## 2024-07-19 ENCOUNTER — Ambulatory Visit: Admit: 2024-07-19 | Discharge: 2024-07-20 | Payer: Medicaid (Managed Care)

## 2024-07-19 DIAGNOSIS — R0602 Shortness of breath: Principal | ICD-10-CM

## 2024-07-19 DIAGNOSIS — J45909 Unspecified asthma, uncomplicated: Principal | ICD-10-CM

## 2024-07-19 MED ORDER — TRELEGY ELLIPTA 100 MCG-62.5 MCG-25 MCG POWDER FOR INHALATION
Freq: Every day | RESPIRATORY_TRACT | 3 refills | 60.00000 days | Status: CP
Start: 2024-07-19 — End: ?

## 2024-07-20 MED ORDER — MIDODRINE 2.5 MG TABLET
ORAL_TABLET | ORAL | 1 refills | 0.00000 days | Status: CP
Start: 2024-07-20 — End: ?

## 2024-08-16 ENCOUNTER — Inpatient Hospital Stay: Admit: 2024-08-16 | Discharge: 2024-08-17 | Payer: Medicaid (Managed Care)

## 2024-08-29 DIAGNOSIS — R55 Syncope and collapse: Principal | ICD-10-CM

## 2024-08-29 DIAGNOSIS — R002 Palpitations: Principal | ICD-10-CM

## 2024-08-29 DIAGNOSIS — R000 Tachycardia, unspecified: Principal | ICD-10-CM

## 2024-08-29 DIAGNOSIS — E782 Mixed hyperlipidemia: Principal | ICD-10-CM

## 2024-10-18 ENCOUNTER — Encounter: Admit: 2024-10-18 | Discharge: 2024-10-19 | Payer: Medicaid (Managed Care)

## 2024-10-18 DIAGNOSIS — R0602 Shortness of breath: Principal | ICD-10-CM

## 2024-10-26 ENCOUNTER — Ambulatory Visit: Admit: 2024-10-26 | Payer: Medicaid (Managed Care)
